# Patient Record
Sex: Female | Born: 1950 | Hispanic: No | Smoking: Never smoker
Health system: Southern US, Community
[De-identification: ages and names within clinical notes are randomized; demographics above are authoritative.]

## PROBLEM LIST (undated history)

## (undated) DIAGNOSIS — E079 Disorder of thyroid, unspecified: Secondary | ICD-10-CM

## (undated) HISTORY — DX: Disorder of thyroid, unspecified: E07.9

---

## 2019-06-21 ENCOUNTER — Other Ambulatory Visit: Payer: Self-pay | Admitting: Gynecology

## 2019-06-21 DIAGNOSIS — E039 Hypothyroidism, unspecified: Secondary | ICD-10-CM | POA: Diagnosis not present

## 2019-06-21 DIAGNOSIS — Z0000 Encounter for general adult medical examination without abnormal findings: Secondary | ICD-10-CM | POA: Diagnosis not present

## 2019-06-21 DIAGNOSIS — Z1231 Encounter for screening mammogram for malignant neoplasm of breast: Secondary | ICD-10-CM | POA: Diagnosis not present

## 2019-06-21 DIAGNOSIS — R5382 Chronic fatigue, unspecified: Secondary | ICD-10-CM | POA: Diagnosis not present

## 2019-06-21 DIAGNOSIS — Z833 Family history of diabetes mellitus: Secondary | ICD-10-CM | POA: Diagnosis not present

## 2019-06-23 ENCOUNTER — Ambulatory Visit
Admission: RE | Admit: 2019-06-23 | Discharge: 2019-06-23 | Disposition: A | Discharge status: Home / Self Care | Payer: Medicare Other | Source: Ambulatory Visit | Attending: Gynecology | Admitting: Gynecology

## 2019-06-23 ENCOUNTER — Other Ambulatory Visit: Payer: Self-pay

## 2019-06-23 DIAGNOSIS — Z1231 Encounter for screening mammogram for malignant neoplasm of breast: Secondary | ICD-10-CM

## 2019-06-24 ENCOUNTER — Other Ambulatory Visit: Payer: Self-pay | Admitting: Gynecology

## 2019-06-24 DIAGNOSIS — R928 Other abnormal and inconclusive findings on diagnostic imaging of breast: Secondary | ICD-10-CM

## 2019-06-28 ENCOUNTER — Other Ambulatory Visit: Payer: Self-pay | Admitting: Internal Medicine

## 2019-06-30 ENCOUNTER — Other Ambulatory Visit: Payer: Self-pay | Admitting: Internal Medicine

## 2019-07-01 ENCOUNTER — Other Ambulatory Visit: Payer: Self-pay | Admitting: Internal Medicine

## 2019-07-04 ENCOUNTER — Other Ambulatory Visit: Payer: Self-pay | Admitting: Gynecology

## 2019-07-04 ENCOUNTER — Ambulatory Visit
Admission: RE | Admit: 2019-07-04 | Discharge: 2019-07-04 | Disposition: A | Discharge status: Home / Self Care | Payer: Medicare Other | Source: Ambulatory Visit | Attending: Gynecology | Admitting: Gynecology

## 2019-07-04 ENCOUNTER — Other Ambulatory Visit: Payer: Self-pay

## 2019-07-04 ENCOUNTER — Ambulatory Visit: Admission: RE | Admit: 2019-07-04 | Payer: Medicare Other | Source: Ambulatory Visit

## 2019-07-04 DIAGNOSIS — R922 Inconclusive mammogram: Secondary | ICD-10-CM | POA: Diagnosis not present

## 2019-07-04 DIAGNOSIS — R928 Other abnormal and inconclusive findings on diagnostic imaging of breast: Secondary | ICD-10-CM

## 2019-12-25 ENCOUNTER — Ambulatory Visit: Payer: Medicare Other

## 2020-01-04 DIAGNOSIS — Z139 Encounter for screening, unspecified: Secondary | ICD-10-CM | POA: Diagnosis not present

## 2020-01-04 DIAGNOSIS — Z9181 History of falling: Secondary | ICD-10-CM | POA: Diagnosis not present

## 2020-01-04 DIAGNOSIS — Z0000 Encounter for general adult medical examination without abnormal findings: Secondary | ICD-10-CM | POA: Diagnosis not present

## 2020-01-13 ENCOUNTER — Ambulatory Visit: Payer: Medicare Other

## 2020-04-23 DIAGNOSIS — R5382 Chronic fatigue, unspecified: Secondary | ICD-10-CM | POA: Diagnosis not present

## 2020-04-23 DIAGNOSIS — E039 Hypothyroidism, unspecified: Secondary | ICD-10-CM | POA: Diagnosis not present

## 2020-04-23 DIAGNOSIS — Z6821 Body mass index (BMI) 21.0-21.9, adult: Secondary | ICD-10-CM | POA: Diagnosis not present

## 2020-06-27 DIAGNOSIS — H353131 Nonexudative age-related macular degeneration, bilateral, early dry stage: Secondary | ICD-10-CM | POA: Diagnosis not present

## 2020-07-24 DIAGNOSIS — M818 Other osteoporosis without current pathological fracture: Secondary | ICD-10-CM | POA: Diagnosis not present

## 2020-07-24 DIAGNOSIS — Z1231 Encounter for screening mammogram for malignant neoplasm of breast: Secondary | ICD-10-CM | POA: Diagnosis not present

## 2020-07-24 DIAGNOSIS — Z833 Family history of diabetes mellitus: Secondary | ICD-10-CM | POA: Diagnosis not present

## 2020-07-24 DIAGNOSIS — Z0000 Encounter for general adult medical examination without abnormal findings: Secondary | ICD-10-CM | POA: Diagnosis not present

## 2020-07-24 DIAGNOSIS — E039 Hypothyroidism, unspecified: Secondary | ICD-10-CM | POA: Diagnosis not present

## 2020-07-25 ENCOUNTER — Other Ambulatory Visit: Payer: Self-pay | Admitting: Internal Medicine

## 2020-07-25 DIAGNOSIS — Z1231 Encounter for screening mammogram for malignant neoplasm of breast: Secondary | ICD-10-CM

## 2020-07-27 ENCOUNTER — Other Ambulatory Visit: Payer: Self-pay

## 2020-07-27 ENCOUNTER — Ambulatory Visit
Admission: RE | Admit: 2020-07-27 | Discharge: 2020-07-27 | Disposition: A | Discharge status: Home / Self Care | Payer: Medicare Other | Source: Ambulatory Visit | Attending: Internal Medicine | Admitting: Internal Medicine

## 2020-07-27 DIAGNOSIS — Z1231 Encounter for screening mammogram for malignant neoplasm of breast: Secondary | ICD-10-CM

## 2020-12-04 IMAGING — MG DIGITAL SCREENING BILAT W/ TOMO W/ CAD
8 series · 9 of 24 positions shown · non-contrast
Comparison: Previous exam(s).

CLINICAL DATA: Screening.

EXAM:
DIGITAL SCREENING BILATERAL MAMMOGRAM WITH TOMO AND CAD

[R CC synth-2D]
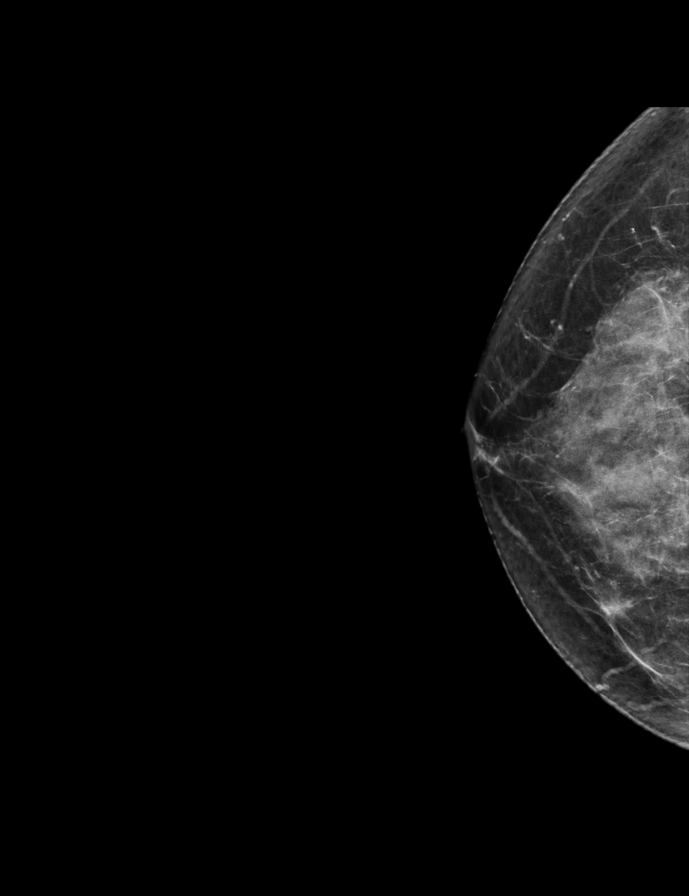

[R MLO synth-2D]
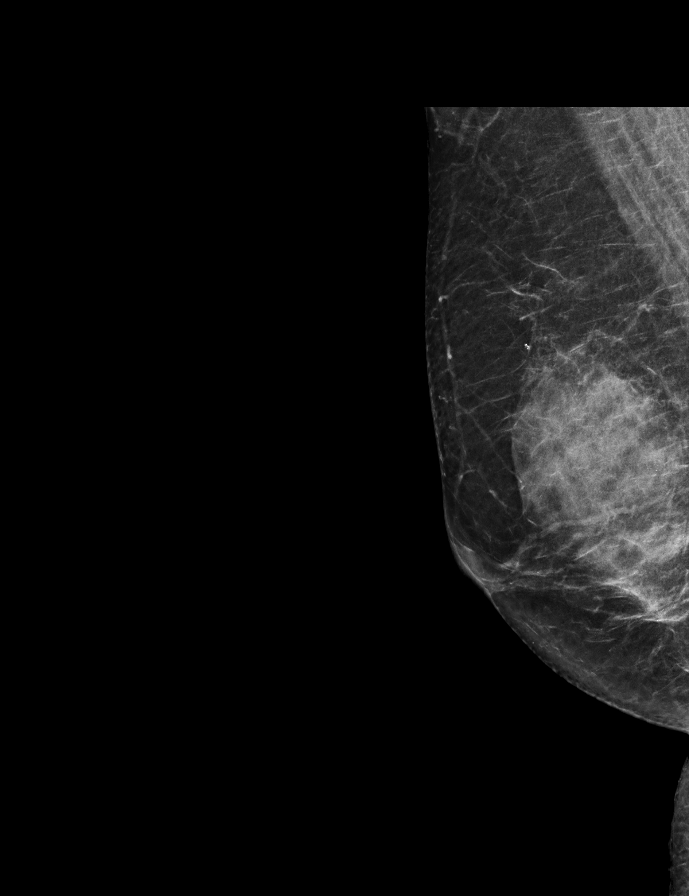

[L MLO synth-2D]
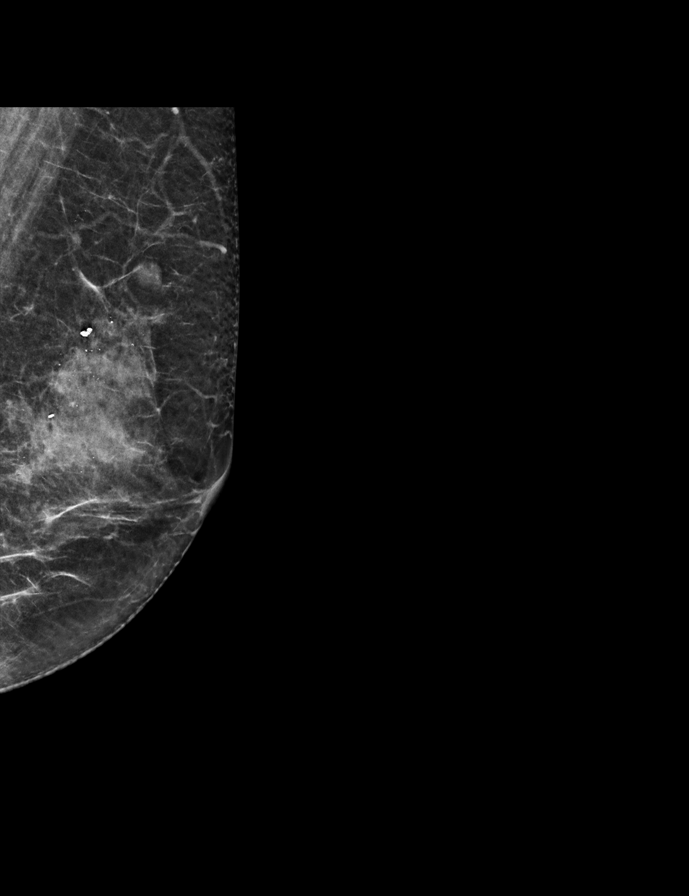

[L CC synth-2D]
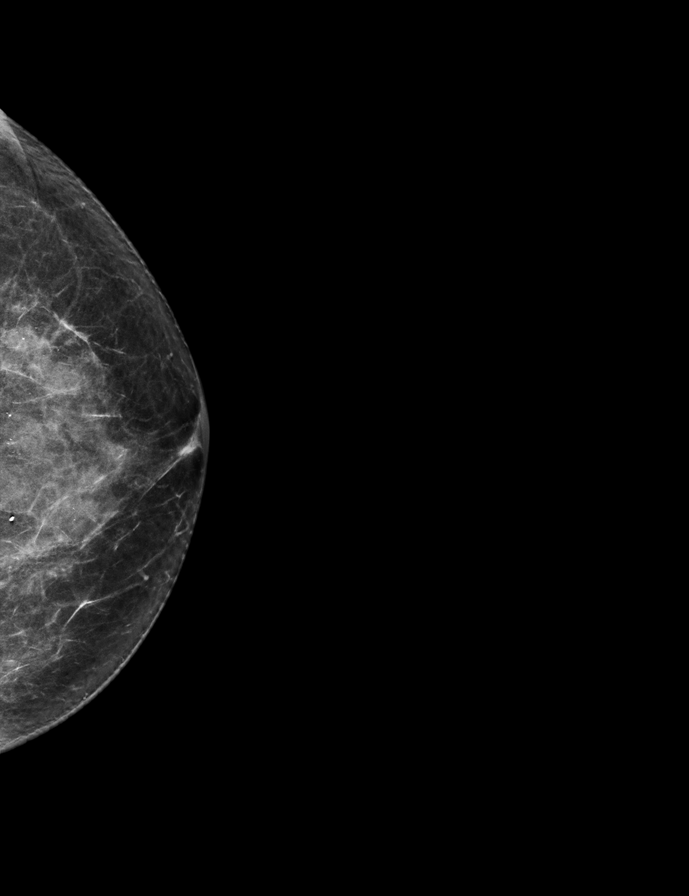

[R CC tomo · 2 of 62 frames shown]
[frame 21/62]
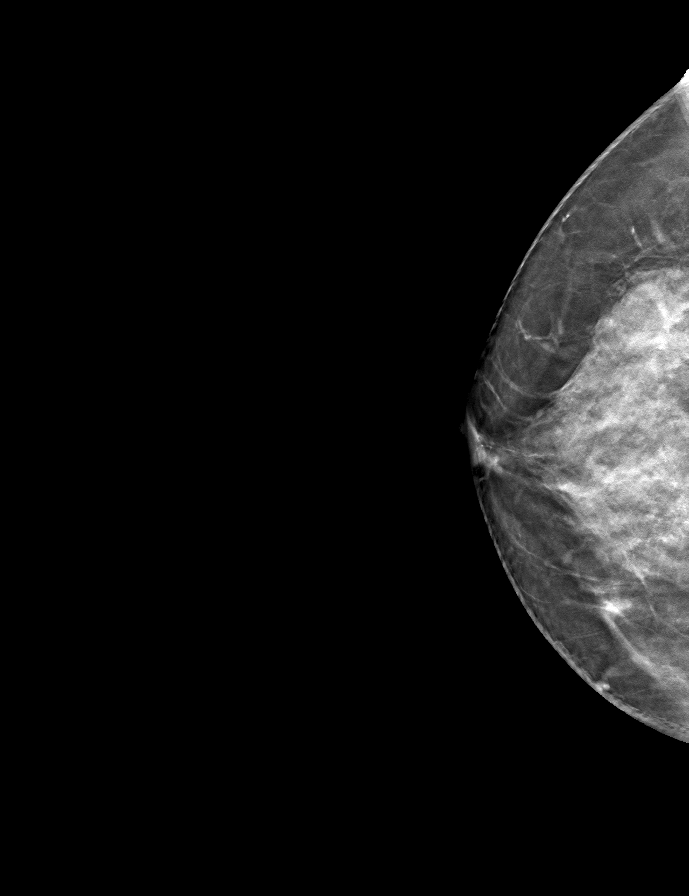
[frame 31/62]
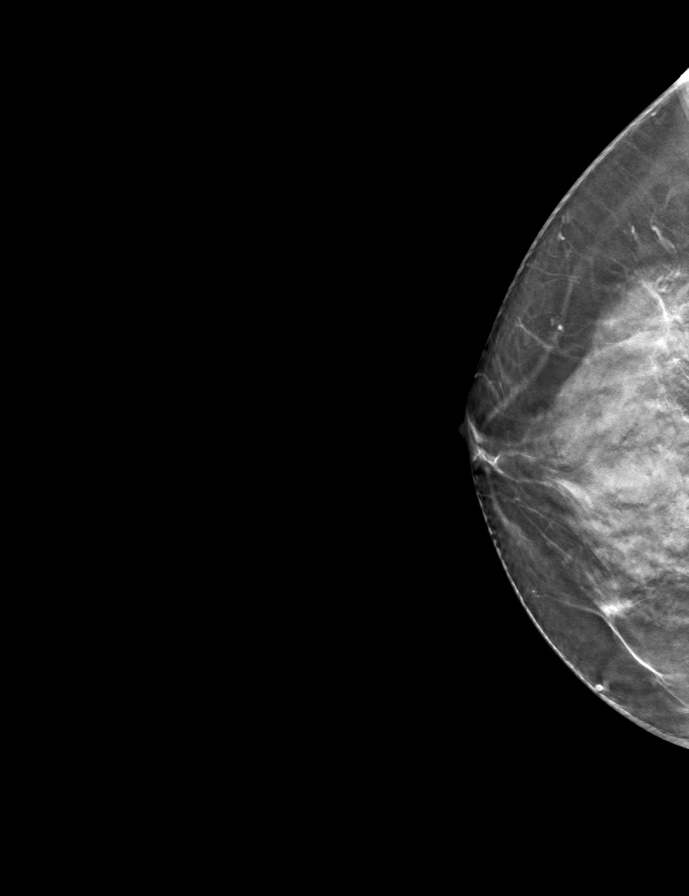

[L CC tomo · tomo slice 31/62.0]
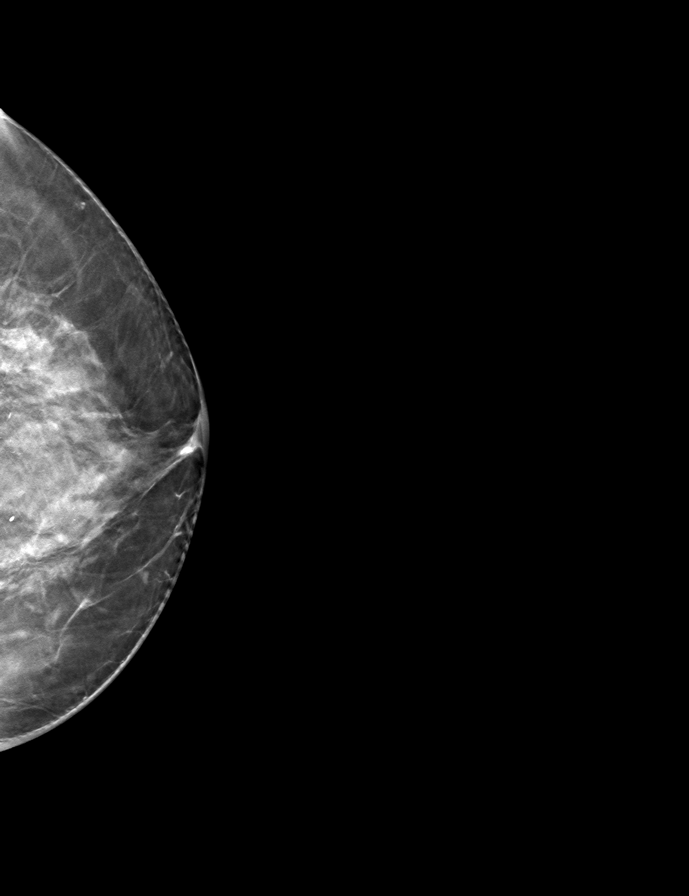

[R MLO tomo · tomo slice 31/60.0]
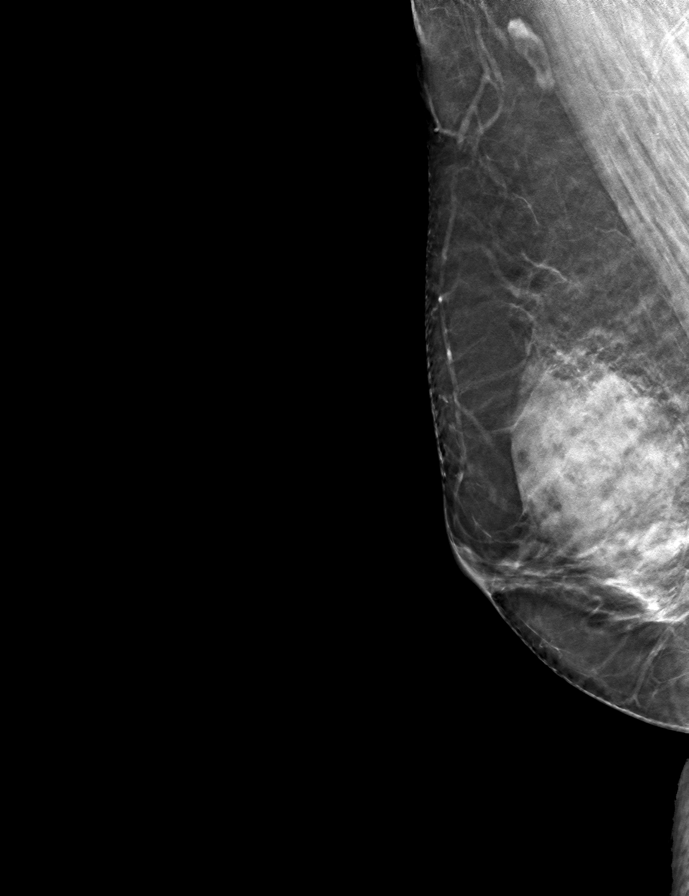

[L MLO tomo · tomo slice 29/58.0]
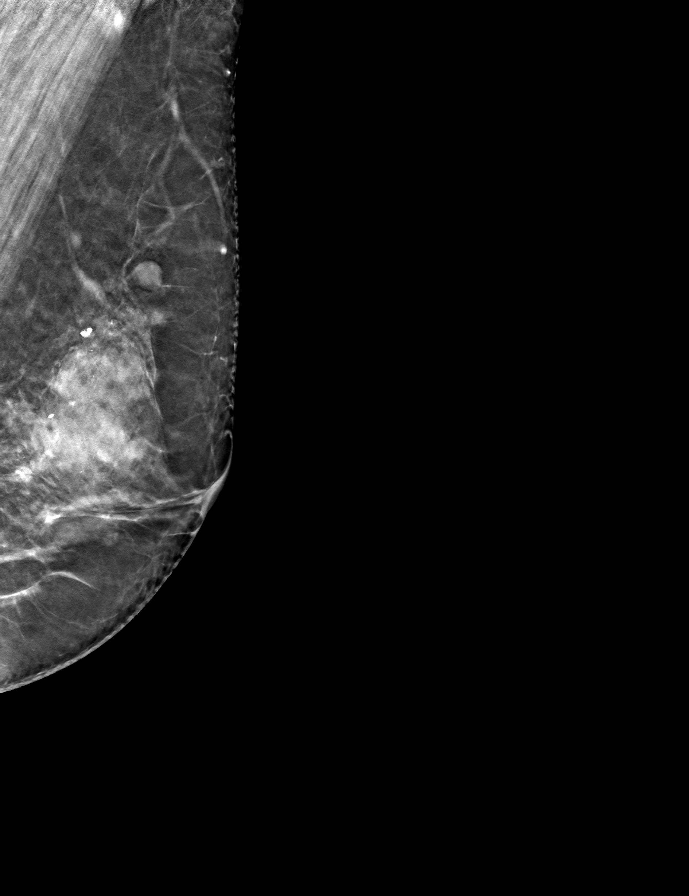

[9 of 24 positions shown; findings below may reference images not displayed]

ACR Breast Density Category d: The breast tissue is extremely dense,
which lowers the sensitivity of mammography
FINDINGS: There are no findings suspicious for malignancy. Images were
processed with CAD.
IMPRESSION: No mammographic evidence of malignancy. A result letter of this
screening mammogram will be mailed directly to the patient.

RECOMMENDATION:
Screening mammogram in one year. (Code:[id number])

BI-RADS CATEGORY  1: Negative.

## 2021-05-15 LAB — BASIC METABOLIC PANEL
BUN: 10 (ref 4–21)
CO2: 27 — AB (ref 13–22)
Chloride: 100 (ref 99–108)
Creatinine: 0.6 (ref 0.5–1.1)
Glucose: 78
Potassium: 4.5 mEq/L (ref 3.5–5.1)
Sodium: 136 — AB (ref 137–147)

## 2021-05-15 LAB — COMPREHENSIVE METABOLIC PANEL
Albumin: 4.4 (ref 3.5–5.0)
Calcium: 9.6 (ref 8.7–10.7)
Globulin: 2.8
eGFR: 91

## 2021-05-15 LAB — HEMOGLOBIN A1C: Hemoglobin A1C: 5.5

## 2021-05-15 LAB — HEPATIC FUNCTION PANEL
ALT: 17 U/L (ref 7–35)
AST: 34 (ref 13–35)
Alkaline Phosphatase: 68 (ref 25–125)
Bilirubin, Total: 0.9

## 2021-05-15 LAB — CBC: RBC: 3.82 — AB (ref 3.87–5.11)

## 2021-05-15 LAB — TSH: TSH: 0.24 — AB (ref 0.41–5.90)

## 2021-05-15 LAB — CBC AND DIFFERENTIAL
HCT: 38 (ref 36–46)
Hemoglobin: 12.3 (ref 12.0–16.0)
Platelets: 185 10*3/uL (ref 150–400)
WBC: 4.7

## 2021-05-16 LAB — LIPID PANEL
Cholesterol: 198 (ref 0–200)
HDL: 86 — AB (ref 35–70)
LDL Cholesterol: 98
Triglycerides: 48 (ref 40–160)

## 2021-05-29 ENCOUNTER — Other Ambulatory Visit: Payer: Self-pay | Admitting: Internal Medicine

## 2021-05-29 DIAGNOSIS — M810 Age-related osteoporosis without current pathological fracture: Secondary | ICD-10-CM

## 2021-05-29 DIAGNOSIS — Z1239 Encounter for other screening for malignant neoplasm of breast: Secondary | ICD-10-CM

## 2021-08-05 ENCOUNTER — Other Ambulatory Visit: Payer: Self-pay

## 2021-08-05 ENCOUNTER — Ambulatory Visit
Admission: RE | Admit: 2021-08-05 | Discharge: 2021-08-05 | Disposition: A | Discharge status: Home / Self Care | Payer: Medicare Other | Source: Ambulatory Visit | Attending: Internal Medicine | Admitting: Internal Medicine

## 2021-08-05 DIAGNOSIS — Z1239 Encounter for other screening for malignant neoplasm of breast: Secondary | ICD-10-CM

## 2021-12-05 ENCOUNTER — Other Ambulatory Visit: Payer: Medicare Other

## 2022-01-28 ENCOUNTER — Ambulatory Visit (INDEPENDENT_AMBULATORY_CARE_PROVIDER_SITE_OTHER): Payer: Medicare Other | Admitting: Family Medicine

## 2022-01-28 ENCOUNTER — Encounter: Payer: Self-pay | Admitting: Family Medicine

## 2022-01-28 DIAGNOSIS — E039 Hypothyroidism, unspecified: Secondary | ICD-10-CM

## 2022-01-28 DIAGNOSIS — M810 Age-related osteoporosis without current pathological fracture: Secondary | ICD-10-CM | POA: Insufficient documentation

## 2022-01-28 DIAGNOSIS — M8580 Other specified disorders of bone density and structure, unspecified site: Secondary | ICD-10-CM | POA: Insufficient documentation

## 2022-01-28 LAB — T4, FREE: Free T4: 1.22 ng/dL (ref 0.60–1.60)

## 2022-01-28 LAB — TSH: TSH: 4.11 u[IU]/mL (ref 0.35–5.50)

## 2022-01-28 MED ORDER — LEVOTHYROXINE SODIUM 50 MCG PO TABS: 50.0000 ug | ORAL_TABLET | Freq: Every day | ORAL | 3 refills | Status: DC

## 2022-01-28 NOTE — Assessment & Plan Note (Signed)
She will get bone density scan later this year.  She is on calcium and vitamin D supplement. ?

## 2022-01-28 NOTE — Assessment & Plan Note (Signed)
Check TSH and T4.  She is on Synthroid 50 mcg daily.  Refill sent in today. ?

## 2022-01-28 NOTE — Patient Instructions (Signed)
It was very nice to see you today! ? ?I will refill your thyroid medication. ? ?We will check your thyroid level today. ? ?Please come back in 3 to 6 months for your annual checkup.  Come back sooner if needed. ? ?Take care, ?Dr Jerline Pain ? ?PLEASE NOTE: ? ?If you had any lab tests please let us know if you have not heard back within a few days. You may see your results on mychart before we have a chance to review them but we will give you a call once they are reviewed by [place]. If we ordered any referrals today, please let us know if you have not heard from their office within the next week.  ? ?Please try these tips to maintain a healthy lifestyle: ? ?Eat at least 3 REAL meals and 1-2 snacks per day.  Aim for no more than 5 hours between eating.  If you eat breakfast, please do so within one hour of getting up.  ? ?Each meal should contain half fruits/vegetables, one quarter protein, and one quarter carbs (no bigger than a computer mouse) ? ?Cut down on sweet beverages. This includes juice, soda, and sweet tea.  ? ?Drink at least 1 glass of water with each meal and aim for at least 8 glasses per day ? ?Exercise at least 150 minutes every week.   ?

## 2022-01-28 NOTE — Progress Notes (Signed)
? ?Anita Spencer is a 71 y.o. female who presents today for an office visit. ? ?Assessment/Plan:  ?Chronic Problems Addressed Today: ?Hypothyroidism ?Check TSH and T4.  She is on Synthroid 50 mcg daily.  Refill sent in today. ? ?Osteopenia ?She will get bone density scan later this year.  She is on calcium and vitamin D supplement. ? ?  ?Subjective:  ?HPI: ? ?Patient here to establish care. She is a new patient. ? ?She has been doing well. She moved to [place] about 3 years ago from [place]. 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 She would like get blood work done today. She would like her thyroid level to be checked today. She has been taking Levothyroxine 50 Mcg daily for hypothyroidism. She is tolerating her medication. No side effects. She is requesting refill on her medication today. She has been exercising regularly. She likes to walk everyday.  ? ?She is up to date on her vaccines and screenings Had bone density scan in [place]. She had a hx of osteoporosis. She is currently taking multivitamin and calcium for this issue. She denies any back pain. ? ?ROS: Per HPI, otherwise a complete review of systems was negative.  ? ?PMH: ? ?The following were reviewed and entered/updated in epic: ?Past Medical History:  ?Diagnosis Date  ? Thyroid disease   ? ?Patient Active Problem List  ? Diagnosis Date Noted  ? Hypothyroidism 01/28/2022  ? Osteopenia 01/28/2022  ? ?History reviewed. No pertinent surgical history. ? ?History reviewed. No pertinent family history. ? ?Medications- reviewed and updated ?Current Outpatient Medications  ?Medication Sig Dispense Refill  ? levothyroxine (SYNTHROID) 50 MCG tablet Take 1 tablet (50 mcg total) by mouth daily before breakfast. 90 tablet 3  ? ?No current facility-administered medications for this visit.  ? ? ?Allergies-reviewed and updated ?Allergies  ?Allergen Reactions  ? Penicillins   ? ? ?Social History  ? ?Socioeconomic History  ? Marital status: Single  ?   Spouse name: Not on file  ? Number of children: Not on file  ? Years of education: Not on file  ? Highest education level: Not on file  ?Occupational History  ? Not on file  ?Tobacco Use  ? Smoking status: Never  ? Smokeless tobacco: Never  ?Vaping Use  ? Vaping Use: Never used  ?Substance and Sexual Activity  ? Alcohol use: Not Currently  ? Drug use: Not Currently  ? Sexual activity: Not Currently  ?Other Topics Concern  ? Not on file  ?Social History Narrative  ? Not on file  ? ?Social Determinants of Health  ? ?Financial Resource Strain: Not on file  ?Food Insecurity: Not on file  ?Transportation Needs: Not on file  ?Physical Activity: Not on file  ?Stress: Not on file  ?Social Connections: Not on file  ? ? ? ?   ?  ?Objective:  ?Physical Exam: ?BP 112/70   Pulse 73   Temp (!) 97.2 ?F (36.2 ?C) (Temporal)   Ht 5' 2.5" (1.588 m)   Wt 121 lb 6.4 oz (55.1 kg)   SpO2 99%   BMI 21.85 kg/m?   ?Gen: No acute distress, resting comfortably ?CV: Regular rate and rhythm with no murmurs appreciated ?Pulm: Normal work of breathing, clear to auscultation bilaterally with no crackles, wheezes, or rhonchi ?Neuro: Grossly normal, moves all extremities ?Psych: Normal affect and thought content ? ?   ? ? ?I,Savera Zaman,acting as a Neurosurgeon for Jacquiline Doe, MD.,have documented all relevant documentation on  the behalf of Jacquiline Doe, MD,as directed by  Jacquiline Doe, MD while in the presence of Jacquiline Doe, MD.  ? ?I, Jacquiline Doe, MD, have reviewed all documentation for this visit. The documentation on 01/28/22 for the exam, diagnosis, procedures, and orders are all accurate and complete. ? ?Katina Degree. Jimmey Ralph, MD ?01/28/2022 9:39 AM  ? ?

## 2022-01-29 NOTE — Progress Notes (Signed)
Please inform patient of the following: ? ?Thyroid levels are NORMAL.  She should continue her current dose and we can recheck at her next office visit.

## 2022-02-19 ENCOUNTER — Ambulatory Visit: Payer: Medicare Other | Admitting: Family Medicine

## 2022-05-22 ENCOUNTER — Ambulatory Visit (INDEPENDENT_AMBULATORY_CARE_PROVIDER_SITE_OTHER): Payer: Medicare Other

## 2022-05-22 DIAGNOSIS — Z0000 Encounter for general adult medical examination without abnormal findings: Secondary | ICD-10-CM

## 2022-05-22 NOTE — Patient Instructions (Signed)
Anita Spencer , Thank you for taking time to come for your Medicare Wellness Visit. I appreciate your ongoing commitment to your health goals. Please review the following plan we discussed and let me know if I can assist you in the future.   Screening recommendations/referrals: Colonoscopy: Done 11/17/13 repeat every 10 years  Mammogram: Done 08/05/21 repeat every year  Bone density 01/28/22 repeat every 2 years  Recommended yearly ophthalmology/optometry visit for glaucoma screening and checkup Recommended yearly dental visit for hygiene and checkup  Vaccinations: Influenza vaccine: done 09/17/21 repeat every 3 years  Pneumococcal vaccine: Up to date Tdap vaccine: Done 06/11/16 repeat every 10 years  Shingles vaccine: completed 6/1, 07/30/15   Covid-19:completed 2/2, 3/4, 09/03/20 & 5/22, 08/01/21  Advanced directives: Advance directive discussed with you today. Even though you declined this today please call our office should you change your mind and we can give you the proper paperwork for you to fill out.  Conditions/risks identified: None at this time   Next appointment: Follow up in one year for your annual wellness visit    Preventive Care 65 Years and Older, Female Preventive care refers to lifestyle choices and visits with your health care provider that can promote health and wellness. What does preventive care include? A yearly physical exam. This is also called an annual well check. Dental exams once or twice a year. Routine eye exams. Ask your health care provider how often you should have your eyes checked. Personal lifestyle choices, including: Daily care of your teeth and gums. Regular physical activity. Eating a healthy diet. Avoiding tobacco and drug use. Limiting alcohol use. Practicing safe sex. Taking low-dose aspirin every day. Taking vitamin and mineral supplements as recommended by your health care provider. What happens during an annual well check? The services and  screenings done by your health care provider during your annual well check will depend on your age, overall health, lifestyle risk factors, and family history of disease. Counseling  Your health care provider may ask you questions about your: Alcohol use. Tobacco use. Drug use. Emotional well-being. Home and relationship well-being. Sexual activity. Eating habits. History of falls. Memory and ability to understand (cognition). Work and work Astronomer. Reproductive health. Screening  You may have the following tests or measurements: Height, weight, and BMI. Blood pressure. Lipid and cholesterol levels. These may be checked every 5 years, or more frequently if you are over 76 years old. Skin check. Lung cancer screening. You may have this screening every year starting at age 7 if you have a 30-pack-year history of smoking and currently smoke or have quit within the past 15 years. Fecal occult blood test (FOBT) of the stool. You may have this test every year starting at age 50. Flexible sigmoidoscopy or colonoscopy. You may have a sigmoidoscopy every 5 years or a colonoscopy every 10 years starting at age 36. Hepatitis C blood test. Hepatitis B blood test. Sexually transmitted disease (STD) testing. Diabetes screening. This is done by checking your blood sugar (glucose) after you have not eaten for a while (fasting). You may have this done every 1-3 years. Bone density scan. This is done to screen for osteoporosis. You may have this done starting at age 2. Mammogram. This may be done every 1-2 years. Talk to your health care provider about how often you should have regular mammograms. Talk with your health care provider about your test results, treatment options, and if necessary, the need for more tests. Vaccines  Your health care  provider may recommend certain vaccines, such as: Influenza vaccine. This is recommended every year. Tetanus, diphtheria, and acellular pertussis (Tdap,  Td) vaccine. You may need a Td booster every 10 years. Zoster vaccine. You may need this after age 72. Pneumococcal 13-valent conjugate (PCV13) vaccine. One dose is recommended after age 53. Pneumococcal polysaccharide (PPSV23) vaccine. One dose is recommended after age 23. Talk to your health care provider about which screenings and vaccines you need and how often you need them. This information is not intended to replace advice given to you by your health care provider. Make sure you discuss any questions you have with your health care provider. Document Released: 11/30/2015 Document Revised: 07/23/2016 Document Reviewed: 09/04/2015 Elsevier Interactive Patient Education  2017 [employer] Prevention in the Home Falls can cause injuries. They can happen to people of all ages. There are many things you can do to make your home safe and to help prevent falls. What can I do on the outside of my home? Regularly fix the edges of walkways and driveways and fix any cracks. Remove anything that might make you trip as you walk through a door, such as a raised step or threshold. Trim any bushes or trees on the path to your home. Use bright outdoor lighting. Clear any walking paths of anything that might make someone trip, such as rocks or tools. Regularly check to see if handrails are loose or broken. Make sure that both sides of any steps have handrails. Any raised decks and porches should have guardrails on the edges. Have any leaves, snow, or ice cleared regularly. Use sand or salt on walking paths during winter. Clean up any spills in your garage right away. This includes oil or grease spills. What can I do in the bathroom? Use night lights. Install grab bars by the toilet and in the tub and shower. Do not use towel bars as grab bars. Use non-skid mats or decals in the tub or shower. If you need to sit down in the shower, use a plastic, non-slip stool. Keep the floor dry. Clean up any  water that spills on the floor as soon as it happens. Remove soap buildup in the tub or shower regularly. Attach bath mats securely with double-sided non-slip rug tape. Do not have throw rugs and other things on the floor that can make you trip. What can I do in the bedroom? Use night lights. Make sure that you have a light by your bed that is easy to reach. Do not use any sheets or blankets that are too big for your bed. They should not hang down onto the floor. Have a firm chair that has side arms. You can use this for support while you get dressed. Do not have throw rugs and other things on the floor that can make you trip. What can I do in the kitchen? Clean up any spills right away. Avoid walking on wet floors. Keep items that you use a lot in easy-to-reach places. If you need to reach something above you, use a strong step stool that has a grab bar. Keep electrical cords out of the way. Do not use floor polish or wax that makes floors slippery. If you must use wax, use non-skid floor wax. Do not have throw rugs and other things on the floor that can make you trip. What can I do with my stairs? Do not leave any items on the stairs. Make sure that there are handrails on both  sides of the stairs and use them. Fix handrails that are broken or loose. Make sure that handrails are as long as the stairways. Check any carpeting to make sure that it is firmly attached to the stairs. Fix any carpet that is loose or worn. Avoid having throw rugs at the top or bottom of the stairs. If you do have throw rugs, attach them to the floor with carpet tape. Make sure that you have a light switch at the top of the stairs and the bottom of the stairs. If you do not have them, ask someone to add them for you. What else can I do to help prevent falls? Wear shoes that: Do not have high heels. Have rubber bottoms. Are comfortable and fit you well. Are closed at the toe. Do not wear sandals. If you use a  stepladder: Make sure that it is fully opened. Do not climb a closed stepladder. Make sure that both sides of the stepladder are locked into place. Ask someone to hold it for you, if possible. Clearly mark and make sure that you can see: Any grab bars or handrails. First and last steps. Where the edge of each step is. Use tools that help you move around (mobility aids) if they are needed. These include: Canes. Walkers. Scooters. Crutches. Turn on the lights when you go into a dark area. Replace any light bulbs as soon as they burn out. Set up your furniture so you have a clear path. Avoid moving your furniture around. If any of your floors are uneven, fix them. If there are any pets around you, be aware of where they are. Review your medicines with your doctor. Some medicines can make you feel dizzy. This can increase your chance of falling. Ask your doctor what other things that you can do to help prevent falls. This information is not intended to replace advice given to you by your health care provider. Make sure you discuss any questions you have with your health care provider. Document Released: 08/30/2009 Document Revised: 04/10/2016 Document Reviewed: 12/08/2014 Elsevier Interactive Patient Education  2017 [employer].

## 2022-05-22 NOTE — Progress Notes (Signed)
Virtual Visit via Telephone Note  I connected with  Anita Spencer on 05/22/22 at  8:30 AM EDT by telephone and verified that I am speaking with the correct person using two identifiers.  Medicare Annual Wellness visit completed telephonically due to Covid-19 pandemic.   Persons participating in this call: This Health Coach and this patient.   Location: Patient: home Provider: office   I discussed the limitations, risks, security and privacy concerns of performing an evaluation and management service by telephone and the availability of in person appointments. The patient expressed understanding and agreed to proceed.  Unable to perform video visit due to video visit attempted and failed and/or patient does not have video capability.   Some vital signs may be absent or patient reported.   Anita Schlein, LPN   Subjective:   Anita Spencer is a 71 y.o. female who presents for an Initial Medicare Annual Wellness Visit.  Review of Systems     Cardiac Risk Factors include: advanced age (>78men, >33 women)     Objective:    There were no vitals filed for this visit. There is no height or weight on file to calculate BMI.     05/22/2022    8:28 AM  Advanced Directives  Does Patient Have a Medical Advance Directive? No  Would patient like information on creating a medical advance directive? No - Patient declined    Current Medications (verified) Outpatient Encounter Medications as of 05/22/2022  Medication Sig   levothyroxine (SYNTHROID) 50 MCG tablet Take 1 tablet (50 mcg total) by mouth daily before breakfast.   No facility-administered encounter medications on file as of 05/22/2022.    Allergies (verified) Penicillins   History: Past Medical History:  Diagnosis Date   Thyroid disease    History reviewed. No pertinent surgical history. History reviewed. No pertinent family history. Social History   Socioeconomic History   Marital status: Divorced    Spouse name: Not on  file   Number of children: Not on file   Years of education: Not on file   Highest education level: Not on file  Occupational History   Not on file  Tobacco Use   Smoking status: Never   Smokeless tobacco: Never  Vaping Use   Vaping Use: Never used  Substance and Sexual Activity   Alcohol use: Not Currently   Drug use: Not Currently   Sexual activity: Not Currently  Other Topics Concern   Not on file  Social History Narrative   Not on file   Social Determinants of Health   Financial Resource Strain: Low Risk  (05/22/2022)   Overall Financial Resource Strain (CARDIA)    Difficulty of Paying Living Expenses: Not hard at all  Food Insecurity: No Food Insecurity (05/22/2022)   Hunger Vital Sign    Worried About Running Out of Food in the Last Year: Never true    Ran Out of Food in the Last Year: Never true  Transportation Needs: No Transportation Needs (05/22/2022)   PRAPARE - Administrator, Civil Service (Medical): No    Lack of Transportation (Non-Medical): No  Physical Activity: Sufficiently Active (05/22/2022)   Exercise Vital Sign    Days of Exercise per Week: 7 days    Minutes of Exercise per Session: 90 min  Stress: No Stress Concern Present (05/22/2022)   [employer] of Occupational Health - Occupational Stress Questionnaire    Feeling of Stress : Not at all  Social Connections: Moderately Isolated (05/22/2022)  Social Advertising account executive [NHANES]    Frequency of Communication with Friends and Family: More than three times a week    Frequency of Social Gatherings with Friends and Family: More than three times a week    Attends Religious Services: More than 4 times per year    Active Member of [employer] or Organizations: No    Attends Engineer, structural: Never    Marital Status: Divorced    Tobacco Counseling Counseling given: Not Answered   Clinical Intake:  Pre-visit preparation completed: Yes  Pain : No/denies pain     BMI -  recorded: 21.85 Nutritional Risks: None Diabetes: No  How often do you need to have someone help you when you read instructions, pamphlets, or other written materials from your doctor or pharmacy?: 1 - Never  Diabetic?no  Interpreter Needed?: No  Information entered by :: Anita Ensign, LPN   Activities of Daily Living    05/22/2022    8:30 AM  In your present state of health, do you have any difficulty performing the following activities:  Hearing? 0  Vision? 0  Difficulty concentrating or making decisions? 0  Walking or climbing stairs? 0  Dressing or bathing? 0  Doing errands, shopping? 0  Preparing Food and eating ? N  Using the Toilet? N  In the past six months, have you accidently leaked urine? N  Do you have problems with loss of bowel control? N  Managing your Medications? N  Managing your Finances? N  Housekeeping or managing your Housekeeping? N    Patient Care Team: Ardith Dark, MD as PCP - General (Family Medicine)  Indicate any recent Medical Services you may have received from other than [facility] providers in the past year (date may be approximate).     Assessment:   This is a routine wellness examination for Anita Spencer.  Hearing/Vision screen Hearing Screening - Comments:: Pt denies any hearing  Vision Screening - Comments:: Pt will follow up with new Eye Dr   Dietary issues and exercise activities discussed: Current Exercise Habits: Home exercise routine, Type of exercise: walking, Time (Minutes): > 60, Frequency (Times/Week): 7, Weekly Exercise (Minutes/Week): 0   Goals Addressed             This Visit's Progress    Patient Stated       None at this time        Depression Screen    05/22/2022    8:26 AM  PHQ 2/9 Scores  PHQ - 2 Score 1    Fall Risk    05/22/2022    8:29 AM  Fall Risk   Falls in the past year? 0  Number falls in past yr: 0  Injury with Fall? 0  Risk for fall due to : Impaired vision  Follow up Falls prevention  discussed    FALL RISK PREVENTION PERTAINING TO THE HOME:  Any stairs in or around the home? Yes  If so, are there any without handrails? No  Home free of loose throw rugs in walkways, pet beds, electrical cords, etc? Yes  Adequate lighting in your home to reduce risk of falls? Yes   ASSISTIVE DEVICES UTILIZED TO PREVENT FALLS:  Life alert? No  Use of a cane, walker or w/c? No  Grab bars in the bathroom? No  Shower chair or bench in shower? No  Elevated toilet seat or a handicapped toilet? No   TIMED UP AND GO:  Was the test performed?  No .   Cognitive Function:        05/22/2022    8:31 AM  6CIT Screen  What Year? 0 points  What month? 0 points  What time? 0 points  Count back from 20 0 points  Months in reverse 0 points  Repeat phrase 0 points  Total Score 0 points    Immunizations Immunization History  Administered Date(s) Administered   Influenza-Unspecified 09/17/2021   Moderna Sars-Covid-2 Vaccination 12/20/2019, 01/19/2020, 09/03/2020, 03/18/2021, 08/01/2021   Pneumococcal Conjugate-13 04/17/2017   Pneumococcal Polysaccharide-23 11/17/2018   Tdap 06/11/2016   Zoster Recombinat (Shingrix) 04/18/2015, 07/30/2015    TDAP status: Up to date  Flu Vaccine status: Up to date  Pneumococcal vaccine status: Up to date  Covid-19 vaccine status: Completed vaccines  Qualifies for Shingles Vaccine? Yes   Zostavax completed Yes   Shingrix Completed?: Yes  Screening Tests Health Maintenance  Topic Date Due   Hepatitis C Screening  Never done   DEXA SCAN  Never done   COVID-19 Vaccine (6 - Moderna series) 09/26/2021   INFLUENZA VACCINE  06/17/2022   MAMMOGRAM  08/06/2023   COLONOSCOPY (Pts 45-77yrs Insurance coverage will need to be confirmed)  11/08/2023   TETANUS/TDAP  06/11/2026   Pneumonia Vaccine 33+ Years old  Completed   Zoster Vaccines- Shingrix  Completed   HPV VACCINES  Aged Out    Health Maintenance  Health Maintenance Due  Topic Date Due    Hepatitis C Screening  Never done   DEXA SCAN  Never done   COVID-19 Vaccine (6 - Moderna series) 09/26/2021    Colorectal cancer screening: Type of screening: Colonoscopy. Completed 11/07/13. Repeat every 10 years  Mammogram status: Completed 08/05/21. Repeat every year    Additional Screening:  Hepatitis C Screening: does qualify   Vision Screening: Recommended annual ophthalmology exams for early detection of glaucoma and other disorders of the eye. Is the patient up to date with their annual eye exam?  Yes  Who is the provider or what is the name of the office in which the patient attends annual eye exams? Wants to find a new provider  If pt is not established with a provider, would they like to be referred to a provider to establish care? Yes .   Dental Screening: Recommended annual dental exams for proper oral hygiene  Community Resource Referral / Chronic Care Management: CRR required this visit?  No   CCM required this visit?  No      Plan:     I have personally reviewed and noted the following in the patient's chart:   Medical and social history Use of alcohol, tobacco or illicit drugs  Current medications and supplements including opioid prescriptions. Patient is not currently taking opioid prescriptions. Functional ability and status Nutritional status Physical activity Advanced directives List of other physicians Hospitalizations, surgeries, and ER visits in previous 12 months Vitals Screenings to include cognitive, depression, and falls Referrals and appointments  In addition, I have reviewed and discussed with patient certain preventive protocols, quality metrics, and best practice recommendations. A written personalized care plan for preventive services as well as general preventive health recommendations were provided to patient.     Anita Schlein, LPN   2/0/9470   Nurse Notes: none

## 2022-05-30 ENCOUNTER — Ambulatory Visit (INDEPENDENT_AMBULATORY_CARE_PROVIDER_SITE_OTHER): Payer: Medicare Other | Admitting: Family Medicine

## 2022-05-30 ENCOUNTER — Encounter: Payer: Self-pay | Admitting: Family Medicine

## 2022-05-30 VITALS — BP 102/68 | HR 64 | Temp 97.6°F | Ht 62.5 in | Wt 115.6 lb

## 2022-05-30 DIAGNOSIS — Z13220 Encounter for screening for lipoid disorders: Secondary | ICD-10-CM

## 2022-05-30 DIAGNOSIS — M8580 Other specified disorders of bone density and structure, unspecified site: Secondary | ICD-10-CM | POA: Diagnosis not present

## 2022-05-30 DIAGNOSIS — R739 Hyperglycemia, unspecified: Secondary | ICD-10-CM | POA: Diagnosis not present

## 2022-05-30 DIAGNOSIS — E039 Hypothyroidism, unspecified: Secondary | ICD-10-CM | POA: Diagnosis not present

## 2022-05-30 DIAGNOSIS — Z1159 Encounter for screening for other viral diseases: Secondary | ICD-10-CM

## 2022-05-30 DIAGNOSIS — Z0001 Encounter for general adult medical examination with abnormal findings: Secondary | ICD-10-CM | POA: Diagnosis not present

## 2022-05-30 DIAGNOSIS — G4730 Sleep apnea, unspecified: Secondary | ICD-10-CM

## 2022-05-30 LAB — COMPREHENSIVE METABOLIC PANEL
ALT: 15 U/L (ref 0–35)
AST: 29 U/L (ref 0–37)
Albumin: 4.4 g/dL (ref 3.5–5.2)
Alkaline Phosphatase: 57 U/L (ref 39–117)
BUN: 11 mg/dL (ref 6–23)
CO2: 28 mEq/L (ref 19–32)
Calcium: 9.4 mg/dL (ref 8.4–10.5)
Chloride: 98 mEq/L (ref 96–112)
Creatinine, Ser: 0.69 mg/dL (ref 0.40–1.20)
GFR: 88.06 mL/min (ref >60.00)
Glucose, Bld: 93 mg/dL (ref 70–99)
Potassium: 4.6 mEq/L (ref 3.5–5.1)
Sodium: 133 mEq/L — ABNORMAL LOW (ref 135–145)
Total Bilirubin: 0.9 mg/dL (ref 0.2–1.2)
Total Protein: 6.9 g/dL (ref 6.0–8.3)

## 2022-05-30 LAB — LIPID PANEL
Cholesterol: 212 mg/dL — ABNORMAL HIGH (ref 0–200)
HDL: 88.7 mg/dL (ref >39.00)
LDL Cholesterol: 115 mg/dL — ABNORMAL HIGH (ref 0–99)
NonHDL: 123.22
Total CHOL/HDL Ratio: 2
Triglycerides: 41 mg/dL (ref 0.0–149.0)
VLDL: 8.2 mg/dL (ref 0.0–40.0)

## 2022-05-30 LAB — T4, FREE: Free T4: 1.13 ng/dL (ref 0.60–1.60)

## 2022-05-30 LAB — CBC
HCT: 35.1 % — ABNORMAL LOW (ref 36.0–46.0)
Hemoglobin: 11.8 g/dL — ABNORMAL LOW (ref 12.0–15.0)
MCHC: 33.6 g/dL (ref 30.0–36.0)
MCV: 97.3 fl (ref 78.0–100.0)
Platelets: 178 10*3/uL (ref 150.0–400.0)
RBC: 3.61 Mil/uL — ABNORMAL LOW (ref 3.87–5.11)
RDW: 13 % (ref 11.5–15.5)
WBC: 4.6 10*3/uL (ref 4.0–10.5)

## 2022-05-30 LAB — HEMOGLOBIN A1C: Hgb A1c MFr Bld: 5.8 % (ref 4.6–6.5)

## 2022-05-30 LAB — TSH: TSH: 2 u[IU]/mL (ref 0.35–5.50)

## 2022-05-30 LAB — T3, FREE: T3, Free: 2.4 pg/mL (ref 2.3–4.2)

## 2022-05-30 NOTE — Patient Instructions (Signed)
It was very nice to see you today!  We will check blood work today.   Keep working on diet and exercise.   Please let me know if you like to have home sleep study done.  We will see back in 6 months.  Please come back to see [place] sooner if needed.  Take care, Dr Jerline Pain  PLEASE NOTE:  If you had any lab tests please let us know if you have not heard back within a few days. You may see your results on mychart before we have a chance to review them but we will give you a call once they are reviewed by [place]. If we ordered any referrals today, please let us know if you have not heard from their office within the next week.   Please try these tips to maintain a healthy lifestyle:  Eat at least 3 REAL meals and 1-2 snacks per day.  Aim for no more than 5 hours between eating.  If you eat breakfast, please do so within one hour of getting up.   Each meal should contain half fruits/vegetables, one quarter protein, and one quarter carbs (no bigger than a computer mouse)  Cut down on sweet beverages. This includes juice, soda, and sweet tea.   Drink at least 1 glass of water with each meal and aim for at least 8 glasses per day  Exercise at least 150 minutes every week.    Preventive Care 62 Years and Older, Female Preventive care refers to lifestyle choices and visits with your health care provider that can promote health and wellness. Preventive care visits are also called wellness exams. What can I expect for my preventive care visit? Counseling Your health care provider may ask you questions about your: Medical history, including: Past medical problems. Family medical history. Pregnancy and menstrual history. History of falls. Current health, including: Memory and ability to understand (cognition). Emotional well-being. Home life and relationship well-being. Sexual activity and sexual health. Lifestyle, including: Alcohol, nicotine or tobacco, and drug use. Access to  firearms. Diet, exercise, and sleep habits. Work and work Statistician. Sunscreen use. Safety issues such as seatbelt and bike helmet use. Physical exam Your health care provider will check your: Height and weight. These may be used to calculate your BMI (body mass index). BMI is a measurement that tells if you are at a healthy weight. Waist circumference. This measures the distance around your waistline. This measurement also tells if you are at a healthy weight and may help predict your risk of certain diseases, such as type 2 diabetes and high blood pressure. Heart rate and blood pressure. Body temperature. Skin for abnormal spots. What immunizations do I need?  Vaccines are usually given at various ages, according to a schedule. Your health care provider will recommend vaccines for you based on your age, medical history, and lifestyle or other factors, such as travel or where you work. What tests do I need? Screening Your health care provider may recommend screening tests for certain conditions. This may include: Lipid and cholesterol levels. Hepatitis C test. Hepatitis B test. HIV (human immunodeficiency virus) test. STI (sexually transmitted infection) testing, if you are at risk. Lung cancer screening. Colorectal cancer screening. Diabetes screening. This is done by checking your blood sugar (glucose) after you have not eaten for a while (fasting). Mammogram. Talk with your health care provider about how often you should have regular mammograms. BRCA-related cancer screening. This may be done if you have a family history  of breast, ovarian, tubal, or peritoneal cancers. Bone density scan. This is done to screen for osteoporosis. Talk with your health care provider about your test results, treatment options, and if necessary, the need for more tests. Follow these instructions at home: Eating and drinking  Eat a diet that includes fresh fruits and vegetables, whole grains, lean  protein, and low-fat dairy products. Limit your intake of foods with high amounts of sugar, saturated fats, and salt. Take vitamin and mineral supplements as recommended by your health care provider. Do not drink alcohol if your health care provider tells you not to drink. If you drink alcohol: Limit how much you have to 0-1 drink a day. Know how much alcohol is in your drink. In the [place] one drink equals one 12 oz bottle of beer (355 mL), one 5 oz glass of wine (148 mL), or one 1 oz glass of hard liquor (44 mL). Lifestyle Brush your teeth every morning and night with fluoride toothpaste. Floss one time each day. Exercise for at least 30 minutes 5 or more days each week. Do not use any products that contain nicotine or tobacco. These products include cigarettes, chewing tobacco, and vaping devices, such as e-cigarettes. If you need help quitting, ask your health care provider. Do not use drugs. If you are sexually active, practice safe sex. Use a condom or other form of protection in order to prevent STIs. Take aspirin only as told by your health care provider. Make sure that you understand how much to take and what form to take. Work with your health care provider to find out whether it is safe and beneficial for you to take aspirin daily. Ask your health care provider if you need to take a cholesterol-lowering medicine (statin). Find healthy ways to manage stress, such as: Meditation, yoga, or listening to music. Journaling. Talking to a trusted person. Spending time with friends and family. Minimize exposure to UV radiation to reduce your risk of skin cancer. Safety Always wear your seat belt while driving or riding in a vehicle. Do not drive: If you have been drinking alcohol. Do not ride with someone who has been drinking. When you are tired or distracted. While texting. If you have been using any mind-altering substances or drugs. Wear a helmet and other protective equipment during  sports activities. If you have firearms in your house, make sure you follow all gun safety procedures. What's next? Visit your health care provider once a year for an annual wellness visit. Ask your health care provider how often you should have your eyes and teeth checked. Stay up to date on all vaccines. This information is not intended to replace advice given to you by your health care provider. Make sure you discuss any questions you have with your health care provider. Document Revised: 05/01/2021 Document Reviewed: 05/01/2021 Elsevier Patient Education  [place].

## 2022-05-30 NOTE — Assessment & Plan Note (Signed)
Her son noticed that she occasionally snores.  No witnessed apneic episodes.  She feels like her sleep is refreshing.  We discussed sleep study if she may be interested in this though we will check with insurance first to see if it is covered.

## 2022-05-30 NOTE — Assessment & Plan Note (Signed)
Check TSH and T4.  She is on Synthroid 50 mcg daily.

## 2022-05-30 NOTE — Assessment & Plan Note (Signed)
Discussed calcium and vitamin D.  She will get bone density scan later this year.

## 2022-05-30 NOTE — Progress Notes (Signed)
Chief Complaint:  Anita Spencer is a 71 y.o. female who presents today for her annual comprehensive physical exam.    Assessment/Plan:  Chronic Problems Addressed Today: Sleep-disordered breathing Her son noticed that she occasionally snores.  No witnessed apneic episodes.  She feels like her sleep is refreshing.  We discussed sleep study if she may be interested in this though we will check with insurance first to see if it is covered.  Osteopenia Discussed calcium and vitamin D.  She will get bone density scan later this year.  Hypothyroidism Check TSH and T4.  She is on Synthroid 50 mcg daily.  Preventative Healthcare: Check labs.  Mammogram due later this year.  No longer needs Pap due to age.  Colonoscopy due next year.  Up-to-date on vaccines.  We will get DEXA scan later this year as well.  Patient Counseling(The following topics were reviewed and/or handout was given):  -Nutrition: Stressed importance of moderation in sodium/caffeine intake, saturated fat and cholesterol, caloric balance, sufficient intake of fresh fruits, vegetables, and fiber.  -Stressed the importance of regular exercise.   -Substance Abuse: Discussed cessation/primary prevention of tobacco, alcohol, or other drug use; driving or other dangerous activities under the influence; availability of treatment for abuse.   -Injury prevention: Discussed safety belts, safety helmets, smoke detector, smoking near bedding or upholstery.   -Sexuality: Discussed sexually transmitted diseases, partner selection, use of condoms, avoidance of unintended pregnancy and contraceptive alternatives.   -Dental health: Discussed importance of regular tooth brushing, flossing, and dental visits.  -Health maintenance and immunizations reviewed. Please refer to Health maintenance section.  Return to care in 1 year for next preventative visit.     Subjective:  HPI:  She has no acute complaints today.   Lifestyle Diet: Balanced.  Plenty of fruits and vegetables.  Exercise: Likes walking daily. 7000 - 10000 steps per day.      05/30/2022    8:39 AM  Depression screen PHQ 2/9  Decreased Interest 0  Down, Depressed, Hopeless 0  PHQ - 2 Score 0    Health Maintenance Due  Topic Date Due   Hepatitis C Screening  Never done   DEXA SCAN  Never done     ROS: Per HPI, otherwise a complete review of systems was negative.   PMH:  The following were reviewed and entered/updated in epic: Past Medical History:  Diagnosis Date   Thyroid disease    Patient Active Problem List   Diagnosis Date Noted   Sleep-disordered breathing 05/30/2022   Hypothyroidism 01/28/2022   Osteopenia 01/28/2022   History reviewed. No pertinent surgical history.  History reviewed. No pertinent family history.  Medications- reviewed and updated Current Outpatient Medications  Medication Sig Dispense Refill   levothyroxine (SYNTHROID) 50 MCG tablet Take 1 tablet (50 mcg total) by mouth daily before breakfast. 90 tablet 3   No current facility-administered medications for this visit.    Allergies-reviewed and updated Allergies  Allergen Reactions   Penicillins     Social History   Socioeconomic History   Marital status: Divorced    Spouse name: Not on file   Number of children: Not on file   Years of education: Not on file   Highest education level: Not on file  Occupational History   Not on file  Tobacco Use   Smoking status: Never   Smokeless tobacco: Never  Vaping Use   Vaping Use: Never used  Substance and Sexual Activity   Alcohol use: Not Currently  Drug use: Not Currently   Sexual activity: Not Currently  Other Topics Concern   Not on file  Social History Narrative   Not on file   Social Determinants of Health   Financial Resource Strain: Low Risk  (05/22/2022)   Overall Financial Resource Strain (CARDIA)    Difficulty of Paying Living Expenses: Not hard at all  Food Insecurity: No Food Insecurity  (05/22/2022)   Hunger Vital Sign    Worried About Running Out of Food in the Last Year: Never true    Ran Out of Food in the Last Year: Never true  Transportation Needs: No Transportation Needs (05/22/2022)   PRAPARE - Administrator, Civil Service (Medical): No    Lack of Transportation (Non-Medical): No  Physical Activity: Sufficiently Active (05/22/2022)   Exercise Vital Sign    Days of Exercise per Week: 7 days    Minutes of Exercise per Session: 90 min  Stress: No Stress Concern Present (05/22/2022)   [employer] of Occupational Health - Occupational Stress Questionnaire    Feeling of Stress : Not at all  Social Connections: Moderately Isolated (05/22/2022)   Social Connection and Isolation Panel [NHANES]    Frequency of Communication with Friends and Family: More than three times a week    Frequency of Social Gatherings with Friends and Family: More than three times a week    Attends Religious Services: More than 4 times per year    Active Member of [employer] or Organizations: No    Attends Banker Meetings: Never    Marital Status: Divorced        Objective:  Physical Exam: BP 102/68   Pulse 64   Temp 97.6 F (36.4 C) (Temporal)   Ht 5' 2.5" (1.588 m)   Wt 115 lb 9.6 oz (52.4 kg)   SpO2 99%   BMI 20.81 kg/m   Body mass index is 20.81 kg/m. Wt Readings from Last 3 Encounters:  05/30/22 115 lb 9.6 oz (52.4 kg)  01/28/22 121 lb 6.4 oz (55.1 kg)   Gen: NAD, resting comfortably HEENT: TMs normal bilaterally. OP clear. No thyromegaly noted.  CV: RRR with no murmurs appreciated Pulm: NWOB, CTAB with no crackles, wheezes, or rhonchi GI: Normal bowel sounds present. Soft, Nontender, Nondistended. MSK: no edema, cyanosis, or clubbing noted Skin: warm, dry Neuro: CN2-12 grossly intact. Strength 5/5 in upper and lower extremities. Reflexes symmetric and intact bilaterally.  Psych: Normal affect and thought content     Zariya Minner M. Jimmey Ralph, MD 05/30/2022  9:09 AM

## 2022-06-02 ENCOUNTER — Encounter: Payer: Self-pay | Admitting: Family Medicine

## 2022-06-02 DIAGNOSIS — E785 Hyperlipidemia, unspecified: Secondary | ICD-10-CM | POA: Insufficient documentation

## 2022-06-02 DIAGNOSIS — R739 Hyperglycemia, unspecified: Secondary | ICD-10-CM | POA: Insufficient documentation

## 2022-06-02 LAB — HEPATITIS C ANTIBODY: Hepatitis C Ab: NONREACTIVE

## 2022-06-02 NOTE — Progress Notes (Signed)
Please inform patient of the following:  Cholesterol and blood sugar levels are borderline but stable.  Everything else is stable.  Do not need to make any changes to treatment plan.  We can recheck in 6 months.

## 2022-06-04 ENCOUNTER — Ambulatory Visit
Admission: RE | Admit: 2022-06-04 | Discharge: 2022-06-04 | Disposition: A | Discharge status: Home / Self Care | Payer: Medicare Other | Source: Ambulatory Visit | Attending: Family Medicine | Admitting: Family Medicine

## 2022-06-04 DIAGNOSIS — M8580 Other specified disorders of bone density and structure, unspecified site: Secondary | ICD-10-CM

## 2022-06-04 DIAGNOSIS — Z0001 Encounter for general adult medical examination with abnormal findings: Secondary | ICD-10-CM

## 2022-06-10 ENCOUNTER — Ambulatory Visit (INDEPENDENT_AMBULATORY_CARE_PROVIDER_SITE_OTHER)
Admission: RE | Admit: 2022-06-10 | Discharge: 2022-06-10 | Disposition: A | Discharge status: Home / Self Care | Payer: Medicare Other | Source: Ambulatory Visit | Attending: Family Medicine | Admitting: Family Medicine

## 2022-06-10 DIAGNOSIS — M8580 Other specified disorders of bone density and structure, unspecified site: Secondary | ICD-10-CM

## 2022-06-10 DIAGNOSIS — M810 Age-related osteoporosis without current pathological fracture: Secondary | ICD-10-CM

## 2022-06-12 NOTE — Progress Notes (Signed)
Please inform patient of the following:  Her bone scan shows that she has osteoporosis. Recommend she schedule a appointment to discuss treatment options.

## 2022-06-16 ENCOUNTER — Encounter: Payer: Self-pay | Admitting: Family Medicine

## 2022-06-16 ENCOUNTER — Other Ambulatory Visit: Payer: Self-pay | Admitting: Internal Medicine

## 2022-06-16 ENCOUNTER — Ambulatory Visit (INDEPENDENT_AMBULATORY_CARE_PROVIDER_SITE_OTHER): Payer: Medicare Other | Admitting: Family Medicine

## 2022-06-16 ENCOUNTER — Other Ambulatory Visit: Payer: Self-pay | Admitting: Family Medicine

## 2022-06-16 VITALS — BP 120/71 | HR 67 | Temp 97.3°F | Ht 62.5 in | Wt 115.6 lb

## 2022-06-16 DIAGNOSIS — E039 Hypothyroidism, unspecified: Secondary | ICD-10-CM

## 2022-06-16 DIAGNOSIS — G4730 Sleep apnea, unspecified: Secondary | ICD-10-CM | POA: Diagnosis not present

## 2022-06-16 DIAGNOSIS — Z1231 Encounter for screening mammogram for malignant neoplasm of breast: Secondary | ICD-10-CM

## 2022-06-16 DIAGNOSIS — R739 Hyperglycemia, unspecified: Secondary | ICD-10-CM

## 2022-06-16 DIAGNOSIS — M810 Age-related osteoporosis without current pathological fracture: Secondary | ICD-10-CM | POA: Diagnosis not present

## 2022-06-16 NOTE — Assessment & Plan Note (Signed)
Last A1c 5.8. Discussed lifestyle modifications. Recheck in 6 months.

## 2022-06-16 NOTE — Assessment & Plan Note (Signed)
Had lengthy discussion with patient regarding her recent DEXA scan which showed a T score of -3.1 at her lumbar spine. We discussed treatment options to lower risk of fracture including fosamax, reclast, and prolia. She would like avoid any medications if possible and deferred starting any medications today. We can recheck DEXA in 2 years. She will continue calcium and vitamin D supplementation.

## 2022-06-16 NOTE — Assessment & Plan Note (Signed)
Last labs at goal. She is on synthroid 50mg  daily.

## 2022-06-16 NOTE — Progress Notes (Signed)
   Anita Spencer is a 71 y.o. female who presents today for an office visit.  Assessment/Plan:  Chronic Problems Addressed Today: Osteoporosis, last DEXA 2023 Had lengthy discussion with patient regarding her recent DEXA scan which showed a T score of -3.1 at her lumbar spine. We discussed treatment options to lower risk of fracture including fosamax, reclast, and prolia. She would like avoid any medications if possible and deferred starting any medications today. We can recheck DEXA in 2 years. She will continue calcium and vitamin D supplementation.   Hyperglycemia Last A1c 5.8. Discussed lifestyle modifications. Recheck in 6 months.   Hypothyroidism Last labs at goal. She is on synthroid 50mg  daily.     Subjective:  HPI:  See A/p for status of chronic conditions. She is here today to discuss her recent DEXA scan.         Objective:  Physical Exam: BP 120/71   Pulse 67   Temp (!) 97.3 F (36.3 C) (Temporal)   Ht 5' 2.5" (1.588 m)   Wt 115 lb 9.6 oz (52.4 kg)   SpO2 98%   BMI 20.81 kg/m   Gen: No acute distress, resting comfortably Neuro: Grossly normal, moves all extremities Psych: Normal affect and thought content      Rayder Sullenger M. , MD 06/16/2022 2:43 PM

## 2022-06-16 NOTE — Patient Instructions (Signed)
It was very nice to see you today!  Please let me know if you change your mind about medications for your bone density. We will recheck again in 2 years.   Take care, Dr Jimmey Ralph  PLEASE NOTE:  If you had any lab tests please let us know if you have not heard back within a few days. You may see your results on mychart before we have a chance to review them but we will give you a call once they are reviewed by [place]. If we ordered any referrals today, please let us know if you have not heard from their office within the next week.   Please try these tips to maintain a healthy lifestyle:  Eat at least 3 REAL meals and 1-2 snacks per day.  Aim for no more than 5 hours between eating.  If you eat breakfast, please do so within one hour of getting up.   Each meal should contain half fruits/vegetables, one quarter protein, and one quarter carbs (no bigger than a computer mouse)  Cut down on sweet beverages. This includes juice, soda, and sweet tea.   Drink at least 1 glass of water with each meal and aim for at least 8 glasses per day  Exercise at least 150 minutes every week.

## 2022-07-03 ENCOUNTER — Telehealth: Payer: Self-pay

## 2022-07-03 ENCOUNTER — Encounter: Payer: Self-pay | Admitting: Family

## 2022-07-03 ENCOUNTER — Ambulatory Visit (INDEPENDENT_AMBULATORY_CARE_PROVIDER_SITE_OTHER): Payer: Medicare Other | Admitting: Family

## 2022-07-03 VITALS — BP 104/67 | HR 71 | Temp 98.6°F | Ht 62.5 in | Wt 115.4 lb

## 2022-07-03 DIAGNOSIS — J020 Streptococcal pharyngitis: Secondary | ICD-10-CM | POA: Diagnosis not present

## 2022-07-03 DIAGNOSIS — U071 COVID-19: Secondary | ICD-10-CM | POA: Diagnosis not present

## 2022-07-03 LAB — POC COVID19 BINAXNOW: SARS Coronavirus 2 Ag: POSITIVE — AB

## 2022-07-03 LAB — POCT RAPID STREP A (OFFICE): Rapid Strep A Screen: NEGATIVE

## 2022-07-03 MED ORDER — AZITHROMYCIN 250 MG PO TABS: ORAL_TABLET | ORAL | 0 refills | Status: AC

## 2022-07-03 MED ORDER — NIRMATRELVIR/RITONAVIR (PAXLOVID)TABLET: 3.0000 | ORAL_TABLET | Freq: Two times a day (BID) | ORAL | 0 refills | Status: AC

## 2022-07-03 NOTE — Addendum Note (Signed)
Addended byDulce Sellar on: 07/03/2022 02:20 PM   Modules accepted: Orders, Level of Service

## 2022-07-03 NOTE — Telephone Encounter (Signed)
I called pt, Pt states she will go back to pharmacy to pick up Z pack.

## 2022-07-03 NOTE — Patient Instructions (Signed)
It was very nice to see you today!    You are positive for Covid infection. I have sent Paxlovid, the antiviral treatment for this infection. Start this today - and take twice a day as directed for 5 days.  Drink plenty of fluids! Ok to take Tylenol, Aleve, Advil, or sinus medication with this medication.  Call back if you are not feeling better in a few days.       PLEASE NOTE:  If you had any lab tests please let us know if you have not heard back within a few days. You may see your results on MyChart before we have a chance to review them but we will give you a call once they are reviewed by [place]. If we ordered any referrals today, please let us know if you have not heard from their office within the next week.

## 2022-07-03 NOTE — Progress Notes (Addendum)
   Patient ID: Anita Spencer, female    DOB: 12-Nov-1951, 70 y.o.   MRN: [id number]  Chief Complaint  Patient presents with   Sore Throat    Pt c/o sore throat, cough and runny nose since yesterday. Has tried [place] medicine which did not help much. Pt states it is hard to swallow.     HPI: Upper Respiratory Infection: Symptoms include congestion and cough, runny nose, and sore throat .  Onset of symptoms was 1 day ago, gradually worsening since that time. She is drinking moderate amounts of fluids. Evaluation to date: none.  Treatment to date:  Tylenol .  Assessment & Plan:  1. Strep throat rapid strep positive. Allergy to PCN, sending Z-pack, Advised on use & SE, ok to take Tylenol or Ibuprofen for sore throat pain, warm salt water gargles.  - POC COVID-19 - POCT rapid strep A - azithromycin (ZITHROMAX) 250 MG tablet; Take 2 tablets on day 1, then 1 tablet daily on days 2 through 5  Dispense: 6 tablet; Refill: 0  2. COVID-19 Sending Paxlovid, pt advised of FDA label for emergency use, how to take, & SE. Advised of CDC guidelines for masking if out in public. OK to continue taking OTC sinus or pain meds. Encouraged to monitor & notify office of any worsening symptoms: increased shortness of breath, weakness, and signs of dehydration. Instructed to rest and hydrate well.   - POC COVID-19 - POCT rapid strep A - nirmatrelvir/ritonavir EUA (PAXLOVID) 20 x 150 MG & 10 x 100MG  TABS; Take 3 tablets by mouth 2 (two) times daily for 5 days. (Take nirmatrelvir 150 mg two tablets twice daily for 5 days and ritonavir 100 mg one tablet twice daily for 5 days) Patient GFR is 78  Dispense: 30 tablet; Refill: 0  Subjective:    Outpatient Medications Prior to Visit  Medication Sig Dispense Refill   levothyroxine (SYNTHROID) 50 MCG tablet Take 1 tablet (50 mcg total) by mouth daily before breakfast. 90 tablet 3   No facility-administered medications prior to visit.   Past Medical History:  Diagnosis  Date   Thyroid disease    History reviewed. No pertinent surgical history. Allergies  Allergen Reactions   Penicillins       Objective:    Physical Exam BP 104/67 (BP Location: Left Arm, Patient Position: Sitting, Cuff Size: Large)   Pulse 71   Temp 98.6 F (37 C) (Temporal)   Ht 5' 2.5" (1.588 m)   Wt 115 lb 6 oz (52.3 kg)   SpO2 98%   BMI 20.77 kg/m  Wt Readings from Last 3 Encounters:  07/03/22 115 lb 6 oz (52.3 kg)  06/16/22 115 lb 9.6 oz (52.4 kg)  05/30/22 115 lb 9.6 oz (52.4 kg)       06/01/22, NP

## 2022-07-03 NOTE — Telephone Encounter (Signed)
-----   Message from Dulce Sellar, NP sent at 07/03/2022  2:13 PM EDT ----- Regarding: call patient be sure she picks up antibiotic for strep throat - apologize and let her know we did not read the test strip correctly the first time - but she needs to take this with the Paxlovid  to treat all of her symptoms.

## 2022-07-29 ENCOUNTER — Institutional Professional Consult (permissible substitution): Payer: Medicare Other | Admitting: Neurology

## 2022-08-06 ENCOUNTER — Ambulatory Visit: Payer: Medicare Other

## 2022-08-11 ENCOUNTER — Encounter: Payer: Self-pay | Admitting: *Deleted

## 2022-08-15 ENCOUNTER — Ambulatory Visit
Admission: RE | Admit: 2022-08-15 | Discharge: 2022-08-15 | Disposition: A | Discharge status: Home / Self Care | Payer: Medicare Other | Source: Ambulatory Visit | Attending: Family Medicine | Admitting: Family Medicine

## 2022-08-15 DIAGNOSIS — Z1231 Encounter for screening mammogram for malignant neoplasm of breast: Secondary | ICD-10-CM

## 2022-08-18 ENCOUNTER — Other Ambulatory Visit: Payer: Self-pay | Admitting: Family Medicine

## 2022-08-18 DIAGNOSIS — R928 Other abnormal and inconclusive findings on diagnostic imaging of breast: Secondary | ICD-10-CM

## 2022-08-26 ENCOUNTER — Other Ambulatory Visit: Payer: Medicare Other

## 2022-08-30 ENCOUNTER — Ambulatory Visit
Admission: RE | Admit: 2022-08-30 | Discharge: 2022-08-30 | Disposition: A | Discharge status: Home / Self Care | Payer: Medicare Other | Source: Ambulatory Visit | Attending: Family Medicine | Admitting: Family Medicine

## 2022-08-30 DIAGNOSIS — R928 Other abnormal and inconclusive findings on diagnostic imaging of breast: Secondary | ICD-10-CM

## 2022-08-30 DIAGNOSIS — R922 Inconclusive mammogram: Secondary | ICD-10-CM | POA: Diagnosis not present

## 2022-08-30 DIAGNOSIS — N6002 Solitary cyst of left breast: Secondary | ICD-10-CM | POA: Diagnosis not present

## 2022-09-22 DIAGNOSIS — H35363 Drusen (degenerative) of macula, bilateral: Secondary | ICD-10-CM | POA: Diagnosis not present

## 2022-10-30 ENCOUNTER — Encounter: Payer: Self-pay | Admitting: *Deleted

## 2022-12-06 ENCOUNTER — Other Ambulatory Visit: Payer: Self-pay | Admitting: Family Medicine

## 2022-12-25 DIAGNOSIS — H11153 Pinguecula, bilateral: Secondary | ICD-10-CM | POA: Diagnosis not present

## 2022-12-25 DIAGNOSIS — H25813 Combined forms of age-related cataract, bilateral: Secondary | ICD-10-CM | POA: Diagnosis not present

## 2022-12-25 DIAGNOSIS — H35032 Hypertensive retinopathy, left eye: Secondary | ICD-10-CM | POA: Diagnosis not present

## 2022-12-25 DIAGNOSIS — H35363 Drusen (degenerative) of macula, bilateral: Secondary | ICD-10-CM | POA: Diagnosis not present

## 2023-01-19 DIAGNOSIS — H25813 Combined forms of age-related cataract, bilateral: Secondary | ICD-10-CM | POA: Diagnosis not present

## 2023-02-04 DIAGNOSIS — H268 Other specified cataract: Secondary | ICD-10-CM | POA: Diagnosis not present

## 2023-02-04 DIAGNOSIS — H25812 Combined forms of age-related cataract, left eye: Secondary | ICD-10-CM | POA: Diagnosis not present

## 2023-02-10 DIAGNOSIS — H25811 Combined forms of age-related cataract, right eye: Secondary | ICD-10-CM | POA: Diagnosis not present

## 2023-02-18 DIAGNOSIS — H268 Other specified cataract: Secondary | ICD-10-CM | POA: Diagnosis not present

## 2023-02-18 DIAGNOSIS — H25811 Combined forms of age-related cataract, right eye: Secondary | ICD-10-CM | POA: Diagnosis not present

## 2023-06-04 ENCOUNTER — Ambulatory Visit: Payer: Medicare Other

## 2023-06-04 VITALS — Wt 115.0 lb

## 2023-06-04 DIAGNOSIS — Z0000 Encounter for general adult medical examination without abnormal findings: Secondary | ICD-10-CM | POA: Diagnosis not present

## 2023-06-04 NOTE — Patient Instructions (Signed)
Anita Spencer , Thank you for taking time to come for your Medicare Wellness Visit. I appreciate your ongoing commitment to your health goals. Please review the following plan we discussed and let me know if I can assist you in the future.   These are the goals we discussed:  Goals      Patient Stated     None at this time         This is a list of the screening recommended for you and due dates:  Health Maintenance  Topic Date Due   COVID-19 Vaccine (7 - 2023-24 season) 07/18/2022   Flu Shot  06/18/2023   Colon Cancer Screening  11/08/2023   Medicare Annual Wellness Visit  06/03/2024   DEXA scan (bone density measurement)  06/10/2024   Mammogram  08/15/2024   DTaP/Tdap/Td vaccine (2 - Td or Tdap) 06/11/2026   Pneumonia Vaccine  Completed   Hepatitis C Screening  Completed   Zoster (Shingles) Vaccine  Completed   HPV Vaccine  Aged Out    Advanced directives: Advance directive discussed with you today. Even though you declined this today please call our office should you change your mind and we can give you the proper paperwork for you to fill out.  Conditions/risks identified: none at this time   Next appointment: Follow up in one year for your annual wellness visit    Preventive Care 65 Years and Older, Female Preventive care refers to lifestyle choices and visits with your health care provider that can promote health and wellness. What does preventive care include? A yearly physical exam. This is also called an annual well check. Dental exams once or twice a year. Routine eye exams. Ask your health care provider how often you should have your eyes checked. Personal lifestyle choices, including: Daily care of your teeth and gums. Regular physical activity. Eating a healthy diet. Avoiding tobacco and drug use. Limiting alcohol use. Practicing safe sex. Taking low-dose aspirin every day. Taking vitamin and mineral supplements as recommended by your health care provider. What  happens during an annual well check? The services and screenings done by your health care provider during your annual well check will depend on your age, overall health, lifestyle risk factors, and family history of disease. Counseling  Your health care provider may ask you questions about your: Alcohol use. Tobacco use. Drug use. Emotional well-being. Home and relationship well-being. Sexual activity. Eating habits. History of falls. Memory and ability to understand (cognition). Work and work Astronomer. Reproductive health. Screening  You may have the following tests or measurements: Height, weight, and BMI. Blood pressure. Lipid and cholesterol levels. These may be checked every 5 years, or more frequently if you are over 48 years old. Skin check. Lung cancer screening. You may have this screening every year starting at age 36 if you have a 30-pack-year history of smoking and currently smoke or have quit within the past 15 years. Fecal occult blood test (FOBT) of the stool. You may have this test every year starting at age 41. Flexible sigmoidoscopy or colonoscopy. You may have a sigmoidoscopy every 5 years or a colonoscopy every 10 years starting at age 41. Hepatitis C blood test. Hepatitis B blood test. Sexually transmitted disease (STD) testing. Diabetes screening. This is done by checking your blood sugar (glucose) after you have not eaten for a while (fasting). You may have this done every 1-3 years. Bone density scan. This is done to screen for osteoporosis. You may have  this done starting at age 9. Mammogram. This may be done every 1-2 years. Talk to your health care provider about how often you should have regular mammograms. Talk with your health care provider about your test results, treatment options, and if necessary, the need for more tests. Vaccines  Your health care provider may recommend certain vaccines, such as: Influenza vaccine. This is recommended every  year. Tetanus, diphtheria, and acellular pertussis (Tdap, Td) vaccine. You may need a Td booster every 10 years. Zoster vaccine. You may need this after age 79. Pneumococcal 13-valent conjugate (PCV13) vaccine. One dose is recommended after age 62. Pneumococcal polysaccharide (PPSV23) vaccine. One dose is recommended after age 32. Talk to your health care provider about which screenings and vaccines you need and how often you need them. This information is not intended to replace advice given to you by your health care provider. Make sure you discuss any questions you have with your health care provider. Document Released: 11/30/2015 Document Revised: 07/23/2016 Document Reviewed: 09/04/2015 Elsevier Interactive Patient Education  2017 [employer].  Fall Prevention in the Home Falls can cause injuries. They can happen to people of all ages. There are many things you can do to make your home safe and to help prevent falls. What can I do on the outside of my home? Regularly fix the edges of walkways and driveways and fix any cracks. Remove anything that might make you trip as you walk through a door, such as a raised step or threshold. Trim any bushes or trees on the path to your home. Use bright outdoor lighting. Clear any walking paths of anything that might make someone trip, such as rocks or tools. Regularly check to see if handrails are loose or broken. Make sure that both sides of any steps have handrails. Any raised decks and porches should have guardrails on the edges. Have any leaves, snow, or ice cleared regularly. Use sand or salt on walking paths during winter. Clean up any spills in your garage right away. This includes oil or grease spills. What can I do in the bathroom? Use night lights. Install grab bars by the toilet and in the tub and shower. Do not use towel bars as grab bars. Use non-skid mats or decals in the tub or shower. If you need to sit down in the shower, use a  plastic, non-slip stool. Keep the floor dry. Clean up any water that spills on the floor as soon as it happens. Remove soap buildup in the tub or shower regularly. Attach bath mats securely with double-sided non-slip rug tape. Do not have throw rugs and other things on the floor that can make you trip. What can I do in the bedroom? Use night lights. Make sure that you have a light by your bed that is easy to reach. Do not use any sheets or blankets that are too big for your bed. They should not hang down onto the floor. Have a firm chair that has side arms. You can use this for support while you get dressed. Do not have throw rugs and other things on the floor that can make you trip. What can I do in the kitchen? Clean up any spills right away. Avoid walking on wet floors. Keep items that you use a lot in easy-to-reach places. If you need to reach something above you, use a strong step stool that has a grab bar. Keep electrical cords out of the way. Do not use floor polish or  wax that makes floors slippery. If you must use wax, use non-skid floor wax. Do not have throw rugs and other things on the floor that can make you trip. What can I do with my stairs? Do not leave any items on the stairs. Make sure that there are handrails on both sides of the stairs and use them. Fix handrails that are broken or loose. Make sure that handrails are as long as the stairways. Check any carpeting to make sure that it is firmly attached to the stairs. Fix any carpet that is loose or worn. Avoid having throw rugs at the top or bottom of the stairs. If you do have throw rugs, attach them to the floor with carpet tape. Make sure that you have a light switch at the top of the stairs and the bottom of the stairs. If you do not have them, ask someone to add them for you. What else can I do to help prevent falls? Wear shoes that: Do not have high heels. Have rubber bottoms. Are comfortable and fit you  well. Are closed at the toe. Do not wear sandals. If you use a stepladder: Make sure that it is fully opened. Do not climb a closed stepladder. Make sure that both sides of the stepladder are locked into place. Ask someone to hold it for you, if possible. Clearly mark and make sure that you can see: Any grab bars or handrails. First and last steps. Where the edge of each step is. Use tools that help you move around (mobility aids) if they are needed. These include: Canes. Walkers. Scooters. Crutches. Turn on the lights when you go into a dark area. Replace any light bulbs as soon as they burn out. Set up your furniture so you have a clear path. Avoid moving your furniture around. If any of your floors are uneven, fix them. If there are any pets around you, be aware of where they are. Review your medicines with your doctor. Some medicines can make you feel dizzy. This can increase your chance of falling. Ask your doctor what other things that you can do to help prevent falls. This information is not intended to replace advice given to you by your health care provider. Make sure you discuss any questions you have with your health care provider. Document Released: 08/30/2009 Document Revised: 04/10/2016 Document Reviewed: 12/08/2014 Elsevier Interactive Patient Education  2017 [employer].

## 2023-06-04 NOTE — Progress Notes (Signed)
Subjective:   Anita Spencer is a 72 y.o. female who presents for Medicare Annual (Subsequent) preventive examination.  Per patient no change in vitals since last visit, unable to obtain new vitals due to telehealth visit   Visit Complete: Virtual  I connected with  Anita Spencer on 06/04/23 by a audio enabled telemedicine application and verified that I am speaking with the correct person using two identifiers.  Patient Location: Home  Provider Location: Office/Clinic  I discussed the limitations of evaluation and management by telemedicine. The patient expressed understanding and agreed to proceed.   Review of Systems     Cardiac Risk Factors include: advanced age (>78men, >13 women);dyslipidemia     Objective:    Today's Vitals   06/04/23 0921  Weight: 115 lb (52.2 kg)   Body mass index is 20.7 kg/m.     06/04/2023    9:24 AM 05/22/2022    8:28 AM  Advanced Directives  Does Patient Have a Medical Advance Directive? No No  Would patient like information on creating a medical advance directive? No - Patient declined No - Patient declined    Current Medications (verified) Outpatient Encounter Medications as of 06/04/2023  Medication Sig   levothyroxine (SYNTHROID) 50 MCG tablet TAKE 1 TABLET BY MOUTH DAILY  BEFORE BREAKFAST   No facility-administered encounter medications on file as of 06/04/2023.    Allergies (verified) Penicillins   History: Past Medical History:  Diagnosis Date   Thyroid disease    History reviewed. No pertinent surgical history. Family History  Problem Relation Age of Onset   Breast cancer Neg Hx    Social History   Socioeconomic History   Marital status: Divorced    Spouse name: Not on file   Number of children: Not on file   Years of education: Not on file   Highest education level: Not on file  Occupational History   Not on file  Tobacco Use   Smoking status: Never   Smokeless tobacco: Never  Vaping Use   Vaping status: Never  Used  Substance and Sexual Activity   Alcohol use: Not Currently   Drug use: Not Currently   Sexual activity: Not Currently  Other Topics Concern   Not on file  Social History Narrative   Not on file   Social Determinants of Health   Financial Resource Strain: Low Risk  (06/04/2023)   Overall Financial Resource Strain (CARDIA)    Difficulty of Paying Living Expenses: Not hard at all  Food Insecurity: No Food Insecurity (06/04/2023)   Hunger Vital Sign    Worried About Running Out of Food in the Last Year: Never true    Ran Out of Food in the Last Year: Never true  Transportation Needs: No Transportation Needs (06/04/2023)   PRAPARE - Administrator, Civil Service (Medical): No    Lack of Transportation (Non-Medical): No  Physical Activity: Sufficiently Active (06/04/2023)   Exercise Vital Sign    Days of Exercise per Week: 7 days    Minutes of Exercise per Session: 90 min  Stress: No Stress Concern Present (06/04/2023)   [employer] of Occupational Health - Occupational Stress Questionnaire    Feeling of Stress : Not at all  Social Connections: Moderately Isolated (06/04/2023)   Social Connection and Isolation Panel [NHANES]    Frequency of Communication with Friends and Family: More than three times a week    Frequency of Social Gatherings with Friends and Family: More than three times  a week    Attends Religious Services: More than 4 times per year    Active Member of Clubs or Organizations: No    Attends Banker Meetings: Never    Marital Status: Divorced    Tobacco Counseling Counseling given: Not Answered   Clinical Intake:  Pre-visit preparation completed: Yes  Pain : No/denies pain     BMI - recorded: 20.7 Nutritional Status: BMI of 19-24  Normal Nutritional Risks: None Diabetes: No  How often do you need to have someone help you when you read instructions, pamphlets, or other written materials from your doctor or pharmacy?: 1 -  Never  Interpreter Needed?: No  Information entered by :: Lanier Ensign, LPN   Activities of Daily Living    06/04/2023    9:22 AM  In your present state of health, do you have any difficulty performing the following activities:  Hearing? 0  Vision? 0  Difficulty concentrating or making decisions? 0  Walking or climbing stairs? 0  Dressing or bathing? 0  Doing errands, shopping? 0  Preparing Food and eating ? N  Using the Toilet? N  In the past six months, have you accidently leaked urine? N  Do you have problems with loss of bowel control? N  Managing your Medications? N  Managing your Finances? N  Housekeeping or managing your Housekeeping? N    Patient Care Team: Ardith Dark, MD as PCP - General (Family Medicine)  Indicate any recent Medical Services you may have received from other than [facility] providers in the past year (date may be approximate).     Assessment:   This is a routine wellness examination for Anita Spencer.  Hearing/Vision screen Hearing Screening - Comments:: Pt denies any hearing issues  Vision Screening - Comments:: Pt follows up with Dr Zenaida Niece for annual eye exams   Dietary issues and exercise activities discussed:     Goals Addressed             This Visit's Progress    Patient Stated       None at  this time        Depression Screen    06/04/2023    9:23 AM 05/30/2022    8:39 AM 05/22/2022    8:26 AM  PHQ 2/9 Scores  PHQ - 2 Score 0 0 1    Fall Risk    06/04/2023    9:25 AM 05/30/2022    8:39 AM 05/22/2022    8:29 AM  Fall Risk   Falls in the past year? 0 0 0  Number falls in past yr: 0 0 0  Injury with Fall? 0 0 0  Risk for fall due to : Impaired vision No Fall Risks Impaired vision  Follow up Falls prevention discussed  Falls prevention discussed    MEDICARE RISK AT HOME:  Medicare Risk at Home - 06/04/23 0925     Any stairs in or around the home? No    If so, are there any without handrails? No    Home free of loose throw  rugs in walkways, pet beds, electrical cords, etc? Yes    Adequate lighting in your home to reduce risk of falls? Yes    Life alert? No    Use of a cane, walker or w/c? No    Grab bars in the bathroom? No    Shower chair or bench in shower? No    Elevated toilet seat or a handicapped toilet? No  TIMED UP AND GO:  Was the test performed?  No    Cognitive Function:declined         05/22/2022    8:31 AM  6CIT Screen  What Year? 0 points  What month? 0 points  What time? 0 points  Count back from 20 0 points  Months in reverse 0 points  Repeat phrase 0 points  Total Score 0 points    Immunizations Immunization History  Administered Date(s) Administered   Influenza-Unspecified 09/17/2021   Moderna Covid-19 Vaccine Bivalent Booster 73yrs & up 07/30/2021   Moderna Sars-Covid-2 Vaccination 12/20/2019, 01/19/2020, 09/03/2020, 03/18/2021, 08/01/2021   Pneumococcal Conjugate-13 04/17/2017   Pneumococcal Polysaccharide-23 11/17/2018   Tdap 06/11/2016   Zoster Recombinant(Shingrix) 04/18/2015, 07/30/2015    TDAP status: Up to date  Flu Vaccine status: Up to date  Pneumococcal vaccine status: Up to date  Covid-19 vaccine status: Completed vaccines  Qualifies for Shingles Vaccine? Yes   Zostavax completed Yes   Shingrix Completed?: Yes  Screening Tests Health Maintenance  Topic Date Due   COVID-19 Vaccine (7 - 2023-24 season) 07/18/2022   INFLUENZA VACCINE  06/18/2023   Colonoscopy  11/08/2023   Medicare Annual Wellness (AWV)  06/03/2024   DEXA SCAN  06/10/2024   MAMMOGRAM  08/15/2024   DTaP/Tdap/Td (2 - Td or Tdap) 06/11/2026   Pneumonia Vaccine 98+ Years old  Completed   Hepatitis C Screening  Completed   Zoster Vaccines- Shingrix  Completed   HPV VACCINES  Aged Out    Health Maintenance  Health Maintenance Due  Topic Date Due   COVID-19 Vaccine (7 - 2023-24 season) 07/18/2022     Colorectal cancer screening: Type of screening:  Colonoscopy. Completed 11/07/13. Repeat every 10 years  Mammogram status: Completed 08/15/22. Repeat every year  Bone Density status: Completed 06/10/22. Results reflect: Bone density results: OSTEOPOROSIS. Repeat every 2 years.   Additional Screening:  Hepatitis C Screening: Completed 05/30/22  Vision Screening: Recommended annual ophthalmology exams for early detection of glaucoma and other disorders of the eye. Is the patient up to date with their annual eye exam?  Yes  Who is the provider or what is the name of the office in which the patient attends annual eye exams? Dr Zenaida Niece If pt is not established with a provider, would they like to be referred to a provider to establish care? No .   Dental Screening: Recommended annual dental exams for proper oral hygiene    Community Resource Referral / Chronic Care Management: CRR required this visit?  No   CCM required this visit?  No     Plan:     I have personally reviewed and noted the following in the patient's chart:   Medical and social history Use of alcohol, tobacco or illicit drugs  Current medications and supplements including opioid prescriptions. Patient is not currently taking opioid prescriptions. Functional ability and status Nutritional status Physical activity Advanced directives List of other physicians Hospitalizations, surgeries, and ER visits in previous 12 months Vitals Screenings to include cognitive, depression, and falls Referrals and appointments  In addition, I have reviewed and discussed with patient certain preventive protocols, quality metrics, and best practice recommendations. A written personalized care plan for preventive services as well as general preventive health recommendations were provided to patient.     [name], LPN   05/07/3085   After Visit Summary: (MyChart) Due to this being a telephonic visit, the after visit summary with patients personalized plan was offered to patient  via MyChart   Nurse Notes: declined at this time pt knowledgeable to questions asked

## 2023-06-16 ENCOUNTER — Ambulatory Visit: Payer: Medicare Other | Admitting: Family Medicine

## 2023-06-25 ENCOUNTER — Ambulatory Visit: Payer: Medicare Other | Admitting: Family Medicine

## 2023-06-29 ENCOUNTER — Ambulatory Visit: Payer: Medicare Other | Admitting: Family Medicine

## 2023-07-02 ENCOUNTER — Encounter (INDEPENDENT_AMBULATORY_CARE_PROVIDER_SITE_OTHER): Payer: Self-pay

## 2023-07-16 ENCOUNTER — Encounter: Payer: Self-pay | Admitting: Family Medicine

## 2023-07-16 ENCOUNTER — Other Ambulatory Visit: Payer: Self-pay | Admitting: *Deleted

## 2023-07-16 ENCOUNTER — Ambulatory Visit (INDEPENDENT_AMBULATORY_CARE_PROVIDER_SITE_OTHER): Payer: Medicare Other | Admitting: Family Medicine

## 2023-07-16 VITALS — BP 137/78 | HR 62 | Temp 97.7°F | Ht 62.5 in | Wt 110.8 lb

## 2023-07-16 DIAGNOSIS — Z0001 Encounter for general adult medical examination with abnormal findings: Secondary | ICD-10-CM | POA: Diagnosis not present

## 2023-07-16 DIAGNOSIS — E785 Hyperlipidemia, unspecified: Secondary | ICD-10-CM | POA: Diagnosis not present

## 2023-07-16 DIAGNOSIS — M810 Age-related osteoporosis without current pathological fracture: Secondary | ICD-10-CM

## 2023-07-16 DIAGNOSIS — R739 Hyperglycemia, unspecified: Secondary | ICD-10-CM

## 2023-07-16 DIAGNOSIS — E039 Hypothyroidism, unspecified: Secondary | ICD-10-CM | POA: Diagnosis not present

## 2023-07-16 LAB — CBC
HCT: 37.2 % (ref 36.0–46.0)
Hemoglobin: 12.5 g/dL (ref 12.0–15.0)
MCHC: 33.6 g/dL (ref 30.0–36.0)
MCV: 97 fl (ref 78.0–100.0)
Platelets: 198 10*3/uL (ref 150.0–400.0)
RBC: 3.84 Mil/uL — ABNORMAL LOW (ref 3.87–5.11)
RDW: 13.1 % (ref 11.5–15.5)
WBC: 4.6 10*3/uL (ref 4.0–10.5)

## 2023-07-16 LAB — COMPREHENSIVE METABOLIC PANEL
ALT: 13 U/L (ref 0–35)
AST: 25 U/L (ref 0–37)
Albumin: 4.3 g/dL (ref 3.5–5.2)
Alkaline Phosphatase: 56 U/L (ref 39–117)
BUN: 11 mg/dL (ref 6–23)
CO2: 25 mEq/L (ref 19–32)
Calcium: 9.5 mg/dL (ref 8.4–10.5)
Chloride: 98 mEq/L (ref 96–112)
Creatinine, Ser: 0.69 mg/dL (ref 0.40–1.20)
GFR: 87.37 mL/min (ref >60.00)
Glucose, Bld: 87 mg/dL (ref 70–99)
Potassium: 4 mEq/L (ref 3.5–5.1)
Sodium: 133 mEq/L — ABNORMAL LOW (ref 135–145)
Total Bilirubin: 0.9 mg/dL (ref 0.2–1.2)
Total Protein: 7.4 g/dL (ref 6.0–8.3)

## 2023-07-16 LAB — LIPID PANEL
Cholesterol: 211 mg/dL — ABNORMAL HIGH (ref 0–200)
HDL: 90.5 mg/dL (ref >39.00)
LDL Cholesterol: 108 mg/dL — ABNORMAL HIGH (ref 0–99)
NonHDL: 120.46
Total CHOL/HDL Ratio: 2
Triglycerides: 62 mg/dL (ref 0.0–149.0)
VLDL: 12.4 mg/dL (ref 0.0–40.0)

## 2023-07-16 LAB — HEMOGLOBIN A1C: Hgb A1c MFr Bld: 5.6 % (ref 4.6–6.5)

## 2023-07-16 LAB — TSH: TSH: 2.28 u[IU]/mL (ref 0.35–5.50)

## 2023-07-16 NOTE — Patient Instructions (Addendum)
It was very nice to see you today!  We will check blood work today.  Please continue to work on diet and exercise.  Please get your flu shot later this year.  You will be getting a colonoscopy later this year or early next year.  Please get your mammogram soon.  Keep an eye on your blood pressure and let us know if it is persistently elevated.  Return in about 1 year (around 07/15/2024) for Annual Physical.   Take care, Dr Jimmey Ralph  PLEASE NOTE:  If you had any lab tests, please let us know if you have not heard back within a few days. You may see your results on mychart before we have a chance to review them but we will give you a call once they are reviewed by [place].   If we ordered any referrals today, please let us know if you have not heard from their office within the next week.   If you had any urgent prescriptions sent in today, please check with the pharmacy within an hour of our visit to make sure the prescription was transmitted appropriately.   Please try these tips to maintain a healthy lifestyle:  Eat at least 3 REAL meals and 1-2 snacks per day.  Aim for no more than 5 hours between eating.  If you eat breakfast, please do so within one hour of getting up.   Each meal should contain half fruits/vegetables, one quarter protein, and one quarter carbs (no bigger than a computer mouse)  Cut down on sweet beverages. This includes juice, soda, and sweet tea.   Drink at least 1 glass of water with each meal and aim for at least 8 glasses per day  Exercise at least 150 minutes every week.    Preventive Care 50 Years and Older, Female Preventive care refers to lifestyle choices and visits with your health care provider that can promote health and wellness. Preventive care visits are also called wellness exams. What can I expect for my preventive care visit? Counseling Your health care provider may ask you questions about your: Medical history, including: Past medical  problems. Family medical history. Pregnancy and menstrual history. History of falls. Current health, including: Memory and ability to understand (cognition). Emotional well-being. Home life and relationship well-being. Sexual activity and sexual health. Lifestyle, including: Alcohol, nicotine or tobacco, and drug use. Access to firearms. Diet, exercise, and sleep habits. Work and work Astronomer. Sunscreen use. Safety issues such as seatbelt and bike helmet use. Physical exam Your health care provider will check your: Height and weight. These may be used to calculate your BMI (body mass index). BMI is a measurement that tells if you are at a healthy weight. Waist circumference. This measures the distance around your waistline. This measurement also tells if you are at a healthy weight and may help predict your risk of certain diseases, such as type 2 diabetes and high blood pressure. Heart rate and blood pressure. Body temperature. Skin for abnormal spots. What immunizations do I need?  Vaccines are usually given at various ages, according to a schedule. Your health care provider will recommend vaccines for you based on your age, medical history, and lifestyle or other factors, such as travel or where you work. What tests do I need? Screening Your health care provider may recommend screening tests for certain conditions. This may include: Lipid and cholesterol levels. Hepatitis C test. Hepatitis B test. HIV (human immunodeficiency virus) test. STI (sexually transmitted infection) testing, if  you are at risk. Lung cancer screening. Colorectal cancer screening. Diabetes screening. This is done by checking your blood sugar (glucose) after you have not eaten for a while (fasting). Mammogram. Talk with your health care provider about how often you should have regular mammograms. BRCA-related cancer screening. This may be done if you have a family history of breast, ovarian, tubal,  or peritoneal cancers. Bone density scan. This is done to screen for osteoporosis. Talk with your health care provider about your test results, treatment options, and if necessary, the need for more tests. Follow these instructions at home: Eating and drinking  Eat a diet that includes fresh fruits and vegetables, whole grains, lean protein, and low-fat dairy products. Limit your intake of foods with high amounts of sugar, saturated fats, and salt. Take vitamin and mineral supplements as recommended by your health care provider. Do not drink alcohol if your health care provider tells you not to drink. If you drink alcohol: Limit how much you have to 0-1 drink a day. Know how much alcohol is in your drink. In the [place] one drink equals one 12 oz bottle of beer (355 mL), one 5 oz glass of wine (148 mL), or one 1 oz glass of hard liquor (44 mL). Lifestyle Brush your teeth every morning and night with fluoride toothpaste. Floss one time each day. Exercise for at least 30 minutes 5 or more days each week. Do not use any products that contain nicotine or tobacco. These products include cigarettes, chewing tobacco, and vaping devices, such as e-cigarettes. If you need help quitting, ask your health care provider. Do not use drugs. If you are sexually active, practice safe sex. Use a condom or other form of protection in order to prevent STIs. Take aspirin only as told by your health care provider. Make sure that you understand how much to take and what form to take. Work with your health care provider to find out whether it is safe and beneficial for you to take aspirin daily. Ask your health care provider if you need to take a cholesterol-lowering medicine (statin). Find healthy ways to manage stress, such as: Meditation, yoga, or listening to music. Journaling. Talking to a trusted person. Spending time with friends and family. Minimize exposure to UV radiation to reduce your risk of skin  cancer. Safety Always wear your seat belt while driving or riding in a vehicle. Do not drive: If you have been drinking alcohol. Do not ride with someone who has been drinking. When you are tired or distracted. While texting. If you have been using any mind-altering substances or drugs. Wear a helmet and other protective equipment during sports activities. If you have firearms in your house, make sure you follow all gun safety procedures. What's next? Visit your health care provider once a year for an annual wellness visit. Ask your health care provider how often you should have your eyes and teeth checked. Stay up to date on all vaccines. This information is not intended to replace advice given to you by your health care provider. Make sure you discuss any questions you have with your health care provider. Document Revised: 05/01/2021 Document Reviewed: 05/01/2021 Elsevier Patient Education  2024 [employer].

## 2023-07-16 NOTE — Assessment & Plan Note (Signed)
Check TSH.  She is on Synthroid 50 mcg daily.

## 2023-07-16 NOTE — Progress Notes (Signed)
Chief Complaint:  Anita Spencer is a 72 y.o. female who presents today for her annual comprehensive physical exam.    Assessment/Plan:  Chronic Problems Addressed Today: Hypothyroidism Check TSH.  She is on Synthroid 50 mcg daily.  Dyslipidemia Check lipids.  Hyperglycemia Check in with see.  Osteoporosis, last DEXA 2023 She is working on calcium and vitamin D supplementation.  Will recheck DEXA next year.  Preventative Healthcare: Check labs.  Due for mammogram next month.  Will be getting colonoscopy later this year or early next year.  Up-to-date on vaccines.  She will get flu vaccine later this year.  Patient Counseling(The following topics were reviewed and/or handout was given):  -Nutrition: Stressed importance of moderation in sodium/caffeine intake, saturated fat and cholesterol, caloric balance, sufficient intake of fresh fruits, vegetables, and fiber.  -Stressed the importance of regular exercise.   -Substance Abuse: Discussed cessation/primary prevention of tobacco, alcohol, or other drug use; driving or other dangerous activities under the influence; availability of treatment for abuse.   -Injury prevention: Discussed safety belts, safety helmets, smoke detector, smoking near bedding or upholstery.   -Sexuality: Discussed sexually transmitted diseases, partner selection, use of condoms, avoidance of unintended pregnancy and contraceptive alternatives.   -Dental health: Discussed importance of regular tooth brushing, flossing, and dental visits.  -Health maintenance and immunizations reviewed. Please refer to Health maintenance section.  Return to care in 1 year for next preventative visit.     Subjective:  HPI:  She has no acute complaints today. See Assessment / plan for status of chronic conditions.   Lifestyle Diet: Balanced. Plenty of fruits and vegetables.  Exercise: Trying to do a lot of walking.      07/16/2023    7:29 AM  Depression screen PHQ 2/9   Decreased Interest 0  Down, Depressed, Hopeless 0  PHQ - 2 Score 0    Health Maintenance Due  Topic Date Due   COVID-19 Vaccine (7 - 2023-24 season) 07/18/2022   Colonoscopy  11/08/2023     ROS: Per HPI, otherwise a complete review of systems was negative.   PMH:  The following were reviewed and entered/updated in epic: Past Medical History:  Diagnosis Date   Thyroid disease    Patient Active Problem List   Diagnosis Date Noted   Dyslipidemia 06/02/2022   Hyperglycemia 06/02/2022   Sleep-disordered breathing 05/30/2022   Hypothyroidism 01/28/2022   Osteoporosis, last DEXA 2023 01/28/2022   History reviewed. No pertinent surgical history.  Family History  Problem Relation Age of Onset   Breast cancer Neg Hx     Medications- reviewed and updated Current Outpatient Medications  Medication Sig Dispense Refill   levothyroxine (SYNTHROID) 50 MCG tablet TAKE 1 TABLET BY MOUTH DAILY  BEFORE BREAKFAST 100 tablet 2   No current facility-administered medications for this visit.    Allergies-reviewed and updated Allergies  Allergen Reactions   Penicillins     Social History   Socioeconomic History   Marital status: Divorced    Spouse name: Not on file   Number of children: Not on file   Years of education: Not on file   Highest education level: Not on file  Occupational History   Not on file  Tobacco Use   Smoking status: Never   Smokeless tobacco: Never  Vaping Use   Vaping status: Never Used  Substance and Sexual Activity   Alcohol use: Not Currently   Drug use: Not Currently   Sexual activity: Not Currently  Other Topics  Concern   Not on file  Social History Narrative   Not on file   Social Determinants of Health   Financial Resource Strain: Low Risk  (06/04/2023)   Overall Financial Resource Strain (CARDIA)    Difficulty of Paying Living Expenses: Not hard at all  Food Insecurity: No Food Insecurity (06/04/2023)   Hunger Vital Sign    Worried  About Running Out of Food in the Last Year: Never true    Ran Out of Food in the Last Year: Never true  Transportation Needs: No Transportation Needs (06/04/2023)   PRAPARE - Administrator, Civil Service (Medical): No    Lack of Transportation (Non-Medical): No  Physical Activity: Sufficiently Active (06/04/2023)   Exercise Vital Sign    Days of Exercise per Week: 7 days    Minutes of Exercise per Session: 90 min  Stress: No Stress Concern Present (06/04/2023)   [employer] of Occupational Health - Occupational Stress Questionnaire    Feeling of Stress : Not at all  Social Connections: Moderately Isolated (06/04/2023)   Social Connection and Isolation Panel [NHANES]    Frequency of Communication with Friends and Family: More than three times a week    Frequency of Social Gatherings with Friends and Family: More than three times a week    Attends Religious Services: More than 4 times per year    Active Member of [employer] or Organizations: No    Attends Engineer, structural: Never    Marital Status: Divorced        Objective:  Physical Exam: BP 137/78   Pulse 62   Temp 97.7 F (36.5 C) (Temporal)   Ht 5' 2.5" (1.588 m)   Wt 110 lb 12.8 oz (50.3 kg)   SpO2 100%   BMI 19.94 kg/m   Body mass index is 19.94 kg/m. Wt Readings from Last 3 Encounters:  07/16/23 110 lb 12.8 oz (50.3 kg)  06/04/23 115 lb (52.2 kg)  07/03/22 115 lb 6 oz (52.3 kg)   Gen: NAD, resting comfortably HEENT: TMs normal bilaterally. OP clear. No thyromegaly noted.  CV: RRR with no murmurs appreciated Pulm: NWOB, CTAB with no crackles, wheezes, or rhonchi GI: Normal bowel sounds present. Soft, Nontender, Nondistended. MSK: no edema, cyanosis, or clubbing noted Skin: warm, dry Neuro: CN2-12 grossly intact. Strength 5/5 in upper and lower extremities. Reflexes symmetric and intact bilaterally.  Psych: Normal affect and thought content     Yoseline Andersson M. Jimmey Ralph, MD 07/16/2023 8:16 AM

## 2023-07-16 NOTE — Assessment & Plan Note (Signed)
Check in with see.

## 2023-07-16 NOTE — Assessment & Plan Note (Signed)
Check lipids 

## 2023-07-16 NOTE — Assessment & Plan Note (Signed)
She is working on calcium and vitamin D supplementation.  Will recheck DEXA next year.

## 2023-07-17 NOTE — Progress Notes (Signed)
Cholesterol mildly elevated in the borderline range but the rest of her labs are all stable compared to previous values.  Do not need to make any changes to treatment plan at this time.  We can recheck in a year.

## 2023-07-21 ENCOUNTER — Telehealth: Payer: Self-pay | Admitting: Family Medicine

## 2023-07-21 NOTE — Telephone Encounter (Signed)
Pt is asking to have Mammo & Colonoscopy ordered. Please advise

## 2023-07-21 NOTE — Telephone Encounter (Signed)
Please advise 

## 2023-07-21 NOTE — Telephone Encounter (Signed)
Ok with me. Please place any necessary orders. 

## 2023-07-22 ENCOUNTER — Other Ambulatory Visit: Payer: Self-pay | Admitting: Family Medicine

## 2023-07-22 DIAGNOSIS — Z1231 Encounter for screening mammogram for malignant neoplasm of breast: Secondary | ICD-10-CM

## 2023-07-22 NOTE — Telephone Encounter (Signed)
Patient notified colonoscopy not due till 10/2023 Can call to schedule her mammogram the office of her choice

## 2023-07-23 ENCOUNTER — Encounter: Payer: Medicare Other | Admitting: Family Medicine

## 2023-08-17 ENCOUNTER — Telehealth: Payer: Self-pay | Admitting: Family Medicine

## 2023-08-17 ENCOUNTER — Ambulatory Visit: Payer: Medicare Other

## 2023-08-17 NOTE — Telephone Encounter (Signed)
Prescription Request  08/17/2023  LOV: 07/16/2023  What is the name of the medication or equipment? levothyroxine (SYNTHROID) 50 MCG tablet    Have you contacted your pharmacy to request a refill? Yes   Which pharmacy would you like this sent to?  [facility] - [place], [place] - [address] [place] [address] Phone: [phone] Fax: [phone]   Patient notified that their request is being sent to the clinical staff for review and that they should receive a response within 2 business days.   Please advise at Home Phone: [phone]

## 2023-08-18 ENCOUNTER — Other Ambulatory Visit: Payer: Self-pay | Admitting: *Deleted

## 2023-08-18 MED ORDER — LEVOTHYROXINE SODIUM 50 MCG PO TABS: 50.0000 ug | ORAL_TABLET | Freq: Every day | ORAL | 2 refills | Status: DC

## 2023-08-18 NOTE — Telephone Encounter (Signed)
Rx send to pharmacy  

## 2023-09-21 ENCOUNTER — Ambulatory Visit: Payer: Medicare Other

## 2023-09-21 ENCOUNTER — Telehealth: Payer: Self-pay | Admitting: Family Medicine

## 2023-09-21 NOTE — Telephone Encounter (Signed)
Caller is requesting approval from provider to dispense medication since manufacturer is changing from Amneal to Lupin. Caller can be reached @ [phone].

## 2023-09-21 NOTE — Telephone Encounter (Signed)
Ok to dispense Rx

## 2023-09-22 NOTE — Telephone Encounter (Signed)
Just to clarify, does message below mean that pharmacy was contacted and informed of this?

## 2023-09-23 ENCOUNTER — Ambulatory Visit
Admission: RE | Admit: 2023-09-23 | Discharge: 2023-09-23 | Disposition: A | Discharge status: Home / Self Care | Payer: Medicare Other | Source: Ambulatory Visit | Attending: Family Medicine | Admitting: Family Medicine

## 2023-09-23 DIAGNOSIS — Z1231 Encounter for screening mammogram for malignant neoplasm of breast: Secondary | ICD-10-CM

## 2023-10-24 DIAGNOSIS — H26492 Other secondary cataract, left eye: Secondary | ICD-10-CM | POA: Diagnosis not present

## 2023-10-24 DIAGNOSIS — H43813 Vitreous degeneration, bilateral: Secondary | ICD-10-CM | POA: Diagnosis not present

## 2023-10-24 DIAGNOSIS — H35372 Puckering of macula, left eye: Secondary | ICD-10-CM | POA: Diagnosis not present

## 2023-10-24 DIAGNOSIS — H35363 Drusen (degenerative) of macula, bilateral: Secondary | ICD-10-CM | POA: Diagnosis not present

## 2023-10-28 ENCOUNTER — Ambulatory Visit (INDEPENDENT_AMBULATORY_CARE_PROVIDER_SITE_OTHER): Payer: Medicare Other | Admitting: *Deleted

## 2023-10-28 DIAGNOSIS — Z23 Encounter for immunization: Secondary | ICD-10-CM | POA: Diagnosis not present

## 2024-04-29 ENCOUNTER — Ambulatory Visit (INDEPENDENT_AMBULATORY_CARE_PROVIDER_SITE_OTHER): Admitting: Family

## 2024-04-29 ENCOUNTER — Encounter: Payer: Self-pay | Admitting: Family

## 2024-04-29 VITALS — BP 135/86 | HR 75 | Temp 98.6°F | Ht 62.5 in | Wt 116.8 lb

## 2024-04-29 DIAGNOSIS — R051 Acute cough: Secondary | ICD-10-CM

## 2024-04-29 NOTE — Progress Notes (Signed)
 Patient ID: Anita Spencer, female    DOB: [birth year], 73 y.o.   MRN: [id number]  Chief Complaint  Patient presents with   Cough    Pt stated that she has had a cough for the past 3 days, no fever just with a runny nose. Covid test was done at on and it was negative  Discussed the use of AI scribe software for clinical note transcription with the patient, who gave verbal consent to proceed.  History of Present Illness Anita Spencer is a 72 year old female who presents with upper respiratory symptoms including runny nose and phlegm.  Symptoms began a few days ago with a runny nose and coughing up creamy-colored phlegm from her throat. She experiences a scratchy throat, likely from coughing. She has taken Nyquil during the day twice, which causes drowsiness. She has not used antihistamines and reports some water intake. There is no congestion, ear pain, or shortness of breath. She has no asthma. She is concerned about transmitting symptoms to her family, who are currently not sick.  Assessment & Plan Upper Respiratory Infection Symptoms consistent with viral URI. Lungs clear on exam. No antibiotics needed. Expected resolution within a week. - Advised saline nasal spray for nasal symptoms. - Recommended Nyquil at night up to 1 week. - Encouraged increased hydration, 2L water daily. - Ok to take OTC oral antihistamine like generic Xyzal or Claritin for runny nose. - Instructed to call if no improvement by mid-next week.   Subjective:    Outpatient Medications Prior to Visit  Medication Sig Dispense Refill   levothyroxine  (SYNTHROID ) 50 MCG tablet Take 1 tablet (50 mcg total) by mouth daily before breakfast. 100 tablet 2   No facility-administered medications prior to visit.   Past Medical History:  Diagnosis Date   Thyroid  disease    History reviewed. No pertinent surgical history. Allergies  Allergen Reactions   Penicillins       Objective:    Physical Exam Vitals and nursing note  reviewed.  Constitutional:      Appearance: Normal appearance. She is not ill-appearing.     Interventions: Face mask in place.  HENT:     Right Ear: Tympanic membrane and ear canal normal.     Left Ear: Tympanic membrane and ear canal normal.     Nose:     Right Sinus: No frontal sinus tenderness.     Left Sinus: No frontal sinus tenderness.     Mouth/Throat:     Mouth: Mucous membranes are moist.     Pharynx: Posterior oropharyngeal erythema (mild) present. No pharyngeal swelling, oropharyngeal exudate or uvula swelling.     Tonsils: No tonsillar exudate or tonsillar abscesses.   Cardiovascular:     Rate and Rhythm: Normal rate and regular rhythm.  Pulmonary:     Effort: Pulmonary effort is normal.     Breath sounds: Normal breath sounds.   Musculoskeletal:        General: Normal range of motion.  Lymphadenopathy:     Head:     Right side of head: No preauricular or posterior auricular adenopathy.     Left side of head: No preauricular or posterior auricular adenopathy.     Cervical: No cervical adenopathy.   Skin:    General: Skin is warm and dry.   Neurological:     Mental Status: She is alert.   Psychiatric:        Mood and Affect: Mood normal.  Behavior: Behavior normal.    BP 135/86   Pulse 75   Temp 98.6 F (37 C)   Ht 5' 2.5 (1.588 m)   Wt 116 lb 12.8 oz (53 kg)   SpO2 96%   BMI 21.02 kg/m  Wt Readings from Last 3 Encounters:  04/29/24 116 lb 12.8 oz (53 kg)  07/16/23 110 lb 12.8 oz (50.3 kg)  06/04/23 115 lb (52.2 kg)       Versa Gore, NP

## 2024-05-17 ENCOUNTER — Telehealth: Payer: Self-pay | Admitting: Family Medicine

## 2024-05-17 NOTE — Telephone Encounter (Signed)
 Prescription Request  05/17/2024  LOV: 07/16/2023  What is the name of the medication or equipment? levothyroxine  (SYNTHROID ) 50 MCG tablet   Have you contacted your pharmacy to request a refill? Yes   Which pharmacy would you like this sent to?  [facility] - [place], [place] - [address] Phone: [phone]  Fax: [phone]      Patient notified that their request is being sent to the clinical staff for review and that they should receive a response within 2 business days.   Please advise at Mobile [phone] (mobile)

## 2024-05-18 MED ORDER — LEVOTHYROXINE SODIUM 50 MCG PO TABS: 50.0000 ug | ORAL_TABLET | Freq: Every day | ORAL | 0 refills | Status: DC

## 2024-05-18 NOTE — Telephone Encounter (Signed)
 Rx sent.

## 2024-06-09 ENCOUNTER — Ambulatory Visit: Payer: Medicare Other

## 2024-06-09 VITALS — Ht 60.0 in | Wt 116.0 lb

## 2024-06-09 DIAGNOSIS — Z0000 Encounter for general adult medical examination without abnormal findings: Secondary | ICD-10-CM

## 2024-06-09 NOTE — Patient Instructions (Addendum)
 Anita Spencer , Thank you for taking time out of your busy schedule to complete your Annual Wellness Visit with me. I enjoyed our conversation and look forward to speaking with you again next year. I, as well as your care team,  appreciate your ongoing commitment to your health goals. Please review the following plan we discussed and let me know if I can assist you in the future. Your Game plan/ To Do List    Referrals: If you haven't heard from the office you've been referred to, please reach out to them at the phone provided.   Follow up Visits: Next Medicare AWV with our clinical staff: 06/14/25   Have you seen your provider in the last 6 months (3 months if uncontrolled diabetes)? No Next Office Visit with your provider: 07/19/24  Clinician Recommendations:  Aim for 30 minutes of exercise or brisk walking, 6-8 glasses of water, and 5 servings of fruits and vegetables each day.       This is a list of the screening recommended for you and due dates:  Health Maintenance  Topic Date Due   COVID-19 Vaccine (7 - 2024-25 season) 07/19/2023   Medicare Annual Wellness Visit  06/03/2024   Colon Cancer Screening  04/29/2025*   DEXA scan (bone density measurement)  06/10/2024   Flu Shot  06/17/2024   Mammogram  09/22/2024   DTaP/Tdap/Td vaccine (2 - Td or Tdap) 06/11/2026   Pneumococcal Vaccine for age over 42  Completed   Hepatitis C Screening  Completed   Zoster (Shingles) Vaccine  Completed   Hepatitis B Vaccine  Aged Out   HPV Vaccine  Aged Out   Meningitis B Vaccine  Aged Out  *Topic was postponed. The date shown is not the original due date.    Advanced directives: (Declined) Advance directive discussed with you today. Even though you declined this today, please call our office should you change your mind, and we can give you the proper paperwork for you to fill out. Advance Care Planning is important because it:  [x]  Makes sure you receive the medical care that is consistent with your  values, goals, and preferences  [x]  It provides guidance to your family and loved ones and reduces their decisional burden about whether or not they are making the right decisions based on your wishes.  Follow the link provided in your after visit summary or read over the paperwork we have mailed to you to help you started getting your Advance Directives in place. If you need assistance in completing these, please reach out to us  so that we can help you!  See attachments for Preventive Care and Fall Prevention Tips.

## 2024-06-09 NOTE — Progress Notes (Addendum)
 Subjective:   Anita Spencer is a 73 y.o. who presents for a Medicare Wellness preventive visit.  As a reminder, Annual Wellness Visits don't include a physical exam, and some assessments may be limited, especially if this visit is performed virtually. We may recommend an in-person follow-up visit with your provider if needed.  Visit Complete: Virtual I connected with  Anita Spencer on 06/09/24 by a audio enabled telemedicine application and verified that I am speaking with the correct person using two identifiers.  Patient Location: Home  Provider Location: Home Office  I discussed the limitations of evaluation and management by telemedicine. The patient expressed understanding and agreed to proceed.  Vital Signs: Because this visit was a virtual/telehealth visit, some criteria may be missing or patient reported. Any vitals not documented were not able to be obtained and vitals that have been documented are patient reported.  VideoDeclined- This patient declined Librarian, academic. Therefore the visit was completed with audio only.  Persons Participating in Visit: Patient.  AWV Questionnaire: No: Patient Medicare AWV questionnaire was not completed prior to this visit.  Cardiac Risk Factors include: advanced age (>44men, >38 women);dyslipidemia     Objective:    Today's Vitals   06/09/24 0848  Weight: 116 lb (52.6 kg)  Height: 5' (1.524 m)   Body mass index is 22.65 kg/m.     06/09/2024    8:54 AM 06/04/2023    9:24 AM 05/22/2022    8:28 AM  Advanced Directives  Does Patient Have a Medical Advance Directive? No No No  Would patient like information on creating a medical advance directive? No - Patient declined No - Patient declined No - Patient declined    Current Medications (verified) Outpatient Encounter Medications as of 06/09/2024  Medication Sig   levothyroxine  (SYNTHROID ) 50 MCG tablet Take 1 tablet (50 mcg total) by mouth daily before  breakfast.   No facility-administered encounter medications on file as of 06/09/2024.    Allergies (verified) Penicillins   History: Past Medical History:  Diagnosis Date   Thyroid  disease    History reviewed. No pertinent surgical history. Family History  Problem Relation Age of Onset   Breast cancer Neg Hx    Social History   Socioeconomic History   Marital status: Divorced    Spouse name: Not on file   Number of children: Not on file   Years of education: Not on file   Highest education level: Not on file  Occupational History   Not on file  Tobacco Use   Smoking status: Never   Smokeless tobacco: Never  Vaping Use   Vaping status: Never Used  Substance and Sexual Activity   Alcohol use: Not Currently   Drug use: Not Currently   Sexual activity: Not Currently  Other Topics Concern   Not on file  Social History Narrative   Not on file   Social Drivers of Health   Financial Resource Strain: Low Risk  (06/09/2024)   Overall Financial Resource Strain (CARDIA)    Difficulty of Paying Living Expenses: Not hard at all  Food Insecurity: No Food Insecurity (06/09/2024)   Hunger Vital Sign    Worried About Running Out of Food in the Last Year: Never true    Ran Out of Food in the Last Year: Never true  Transportation Needs: No Transportation Needs (06/09/2024)   PRAPARE - Administrator, Civil Service (Medical): No    Lack of Transportation (Non-Medical): No  Physical Activity: Sufficiently Active (06/09/2024)   Exercise Vital Sign    Days of Exercise per Week: 7 days    Minutes of Exercise per Session: 60 min  Stress: No Stress Concern Present (06/09/2024)   [employer] of Occupational Health - Occupational Stress Questionnaire    Feeling of Stress: Not at all  Social Connections: Moderately Isolated (06/09/2024)   Social Connection and Isolation Panel    Frequency of Communication with Friends and Family: More than three times a week    Frequency  of Social Gatherings with Friends and Family: More than three times a week    Attends Religious Services: More than 4 times per year    Active Member of [employer] or Organizations: No    Attends Banker Meetings: Never    Marital Status: Divorced    Tobacco Counseling Counseling given: Not Answered    Clinical Intake:  Pre-visit preparation completed: Yes  Pain : No/denies pain     BMI - recorded: 22.65 Nutritional Status: BMI of 19-24  Normal Diabetes: No  Lab Results  Component Value Date   HGBA1C 5.6 07/16/2023   HGBA1C 5.8 05/30/2022   HGBA1C 5.5 05/15/2021     How often do you need to have someone help you when you read instructions, pamphlets, or other written materials from your doctor or pharmacy?: 1 - Never  Interpreter Needed?: No  Information entered by :: Ellouise Haws, LPN   Activities of Daily Living     06/09/2024    8:50 AM  In your present state of health, do you have any difficulty performing the following activities:  Hearing? 0  Vision? 0  Difficulty concentrating or making decisions? 0  Walking or climbing stairs? 0  Dressing or bathing? 0  Doing errands, shopping? 0  Preparing Food and eating ? N  Using the Toilet? N  In the past six months, have you accidently leaked urine? N  Do you have problems with loss of bowel control? N  Managing your Medications? N  Managing your Finances? N  Housekeeping or managing your Housekeeping? N    Patient Care Team: Kennyth Worth HERO, MD as PCP - General (Family Medicine)  I have updated your Care Teams any recent Medical Services you may have received from other providers in the past year.     Assessment:   This is a routine wellness examination for Anita Spencer.  Hearing/Vision screen Hearing Screening - Comments:: Pt denies any  hearing issues  Vision Screening - Comments:: Wears rx glasses - up to date with routine eye exams with Dr Vivian     Goals Addressed             This  Visit's Progress    Patient Stated       Maintain health and activity       Depression Screen     06/09/2024    8:51 AM 04/29/2024    3:02 PM 07/16/2023    7:29 AM 06/04/2023    9:23 AM 05/30/2022    8:39 AM 05/22/2022    8:26 AM  PHQ 2/9 Scores  PHQ - 2 Score 0 0 0 0 0 1    Fall Risk     06/09/2024    8:54 AM 04/29/2024    2:57 PM 07/16/2023    7:29 AM 06/04/2023    9:25 AM 05/30/2022    8:39 AM  Fall Risk   Falls in the past year? 0 0 0 0 0  Number falls in past yr: 0 0 0 0 0  Injury with Fall? 0 0 0 0 0  Risk for fall due to : No Fall Risks No Fall Risks No Fall Risks Impaired vision No Fall Risks  Follow up Falls evaluation completed Falls evaluation completed  Falls prevention discussed     MEDICARE RISK AT HOME:  Medicare Risk at Home Any stairs in or around the home?: Yes If so, are there any without handrails?: No Home free of loose throw rugs in walkways, pet beds, electrical cords, etc?: Yes Adequate lighting in your home to reduce risk of falls?: Yes Life alert?: No Use of a cane, walker or w/c?: No Grab bars in the bathroom?: No Shower chair or bench in shower?: No Elevated toilet seat or a handicapped toilet?: No  TIMED UP AND GO:  Was the test performed?  No  Cognitive Function: Unable: Due to language barrier, hearing or vision limitations or otherPt alert and able to answr questions appriopriately    06/09/2024    9:43 AM  MMSE - Mini Mental State Exam  Not completed: Unable to complete        05/22/2022    8:31 AM  6CIT Screen  What Year? 0 points  What month? 0 points  What time? 0 points  Count back from 20 0 points  Months in reverse 0 points  Repeat phrase 0 points  Total Score 0 points    Immunizations Immunization History  Administered Date(s) Administered    sv, Bivalent, Protein Subunit Rsvpref,pf (Abrysvo) 10/06/2022   Fluad Quad(high Dose 65+) 09/15/2022   Fluad Trivalent(High Dose 65+) 10/28/2023   Influenza-Unspecified  09/17/2021   Moderna Covid-19 Vaccine Bivalent Booster 24yrs & up 07/30/2021   Moderna Sars-Covid-2 Vaccination 12/20/2019, 01/19/2020, 09/03/2020, 03/18/2021, 08/01/2021   Pneumococcal Conjugate-13 04/17/2017   Pneumococcal Polysaccharide-23 11/17/2018   Tdap 06/11/2016   Zoster Recombinant(Shingrix) 04/18/2015, 07/30/2015, 03/27/2023, 05/26/2023    Screening Tests Health Maintenance  Topic Date Due   COVID-19 Vaccine (7 - 2024-25 season) 07/19/2023   Colonoscopy  04/29/2025 (Originally 11/08/2023)   DEXA SCAN  06/10/2024   INFLUENZA VACCINE  06/17/2024   MAMMOGRAM  09/22/2024   Medicare Annual Wellness (AWV)  06/09/2025   DTaP/Tdap/Td (2 - Td or Tdap) 06/11/2026   Pneumococcal Vaccine: 50+ Years  Completed   Hepatitis C Screening  Completed   Zoster Vaccines- Shingrix  Completed   Hepatitis B Vaccines  Aged Out   HPV VACCINES  Aged Out   Meningococcal B Vaccine  Aged Out    Health Maintenance  Health Maintenance Due  Topic Date Due   COVID-19 Vaccine (7 - 2024-25 season) 07/19/2023   Health Maintenance Items Addressed: See Nurse Notes at the end of this note  Additional Screening:  Vision Screening: Recommended annual ophthalmology exams for early detection of glaucoma and other disorders of the eye. Would you like a referral to an eye doctor? No    Dental Screening: Recommended annual dental exams for proper oral hygiene  Community Resource Referral / Chronic Care Management: CRR required this visit?  No   CCM required this visit?  No   Plan:    I have personally reviewed and noted the following in the patient's chart:   Medical and social history Use of alcohol, tobacco or illicit drugs  Current medications and supplements including opioid prescriptions. Patient is not currently taking opioid prescriptions. Functional ability and status Nutritional status Physical activity Advanced directives List of other physicians Hospitalizations,  surgeries, and  ER visits in previous 12 months Vitals Screenings to include cognitive, depression, and falls Referrals and appointments  In addition, I have reviewed and discussed with patient certain preventive protocols, quality metrics, and best practice recommendations. A written personalized care plan for preventive services as well as general preventive health recommendations were provided to patient.   Ellouise VEAR Haws, LPN   [id number]/[id number]   After Visit Summary: (MyChart) Due to this being a telephonic visit, the after visit summary with patients personalized plan was offered to patient via MyChart   Notes: Nothing significant to report at this time.

## 2024-07-06 ENCOUNTER — Telehealth: Payer: Self-pay | Admitting: Family Medicine

## 2024-07-06 NOTE — Telephone Encounter (Signed)
 Pt requesting to have labs done prior to CPE. Please advise

## 2024-07-07 ENCOUNTER — Other Ambulatory Visit: Payer: Self-pay | Admitting: *Deleted

## 2024-07-07 DIAGNOSIS — E785 Hyperlipidemia, unspecified: Secondary | ICD-10-CM

## 2024-07-07 DIAGNOSIS — Z0001 Encounter for general adult medical examination with abnormal findings: Secondary | ICD-10-CM

## 2024-07-07 DIAGNOSIS — E039 Hypothyroidism, unspecified: Secondary | ICD-10-CM

## 2024-07-07 DIAGNOSIS — R739 Hyperglycemia, unspecified: Secondary | ICD-10-CM

## 2024-07-07 NOTE — Telephone Encounter (Signed)
 Please schedule an office visit prior to CPE appt

## 2024-07-07 NOTE — Telephone Encounter (Signed)
**Note De-identified  [place] Obfuscation** Please advise 

## 2024-07-07 NOTE — Telephone Encounter (Signed)
 Ok with me. Please place any necessary orders.

## 2024-07-12 ENCOUNTER — Other Ambulatory Visit (INDEPENDENT_AMBULATORY_CARE_PROVIDER_SITE_OTHER)

## 2024-07-12 DIAGNOSIS — R739 Hyperglycemia, unspecified: Secondary | ICD-10-CM | POA: Diagnosis not present

## 2024-07-12 DIAGNOSIS — E039 Hypothyroidism, unspecified: Secondary | ICD-10-CM

## 2024-07-12 DIAGNOSIS — E785 Hyperlipidemia, unspecified: Secondary | ICD-10-CM

## 2024-07-12 DIAGNOSIS — Z0001 Encounter for general adult medical examination with abnormal findings: Secondary | ICD-10-CM

## 2024-07-12 LAB — CBC
HCT: 35.2 % — ABNORMAL LOW (ref 36.0–46.0)
Hemoglobin: 11.9 g/dL — ABNORMAL LOW (ref 12.0–15.0)
MCHC: 33.7 g/dL (ref 30.0–36.0)
MCV: 96.4 fl (ref 78.0–100.0)
Platelets: 197 K/uL (ref 150.0–400.0)
RBC: 3.65 Mil/uL — ABNORMAL LOW (ref 3.87–5.11)
RDW: 13.6 % (ref 11.5–15.5)
WBC: 4.3 K/uL (ref 4.0–10.5)

## 2024-07-12 LAB — LIPID PANEL
Cholesterol: 212 mg/dL — ABNORMAL HIGH (ref 0–200)
HDL: 92.5 mg/dL (ref >39.00)
LDL Cholesterol: 111 mg/dL — ABNORMAL HIGH (ref 0–99)
NonHDL: 119.92
Total CHOL/HDL Ratio: 2
Triglycerides: 47 mg/dL (ref 0.0–149.0)
VLDL: 9.4 mg/dL (ref 0.0–40.0)

## 2024-07-12 LAB — COMPREHENSIVE METABOLIC PANEL WITH GFR
ALT: 11 U/L (ref 0–35)
AST: 23 U/L (ref 0–37)
Albumin: 4.4 g/dL (ref 3.5–5.2)
Alkaline Phosphatase: 53 U/L (ref 39–117)
BUN: 11 mg/dL (ref 6–23)
CO2: 26 meq/L (ref 19–32)
Calcium: 9 mg/dL (ref 8.4–10.5)
Chloride: 96 meq/L (ref 96–112)
Creatinine, Ser: 0.57 mg/dL (ref 0.40–1.20)
GFR: 90.85 mL/min (ref >60.00)
Glucose, Bld: 94 mg/dL (ref 70–99)
Potassium: 4.5 meq/L (ref 3.5–5.1)
Sodium: 133 meq/L — ABNORMAL LOW (ref 135–145)
Total Bilirubin: 0.7 mg/dL (ref 0.2–1.2)
Total Protein: 7.4 g/dL (ref 6.0–8.3)

## 2024-07-12 LAB — HEMOGLOBIN A1C: Hgb A1c MFr Bld: 6 % (ref 4.6–6.5)

## 2024-07-12 LAB — TSH: TSH: 2.91 u[IU]/mL (ref 0.35–5.50)

## 2024-07-14 ENCOUNTER — Ambulatory Visit: Payer: Self-pay | Admitting: Family Medicine

## 2024-07-14 DIAGNOSIS — D72818 Other decreased white blood cell count: Secondary | ICD-10-CM

## 2024-07-14 NOTE — Progress Notes (Signed)
 Cholesterol borderline elevated but stable compared to previous.  His blood counts did drop a little bit.  We should recheck this again in a month or so.  Please place future order for CBC with differential, iron panel, and B12.  His A1c is up a little bit into the prediabetic range.  We can recheck this again in a year but he should work on cutting down sweets and carbs.  The rest of his labs are all at goal and we can recheck in a year.

## 2024-07-19 ENCOUNTER — Encounter: Payer: Self-pay | Admitting: Family Medicine

## 2024-07-19 ENCOUNTER — Ambulatory Visit (INDEPENDENT_AMBULATORY_CARE_PROVIDER_SITE_OTHER): Payer: Medicare Other | Admitting: Family Medicine

## 2024-07-19 VITALS — BP 132/83 | HR 66 | Temp 96.6°F | Ht 60.0 in | Wt 113.2 lb

## 2024-07-19 DIAGNOSIS — M810 Age-related osteoporosis without current pathological fracture: Secondary | ICD-10-CM | POA: Diagnosis not present

## 2024-07-19 DIAGNOSIS — Z0001 Encounter for general adult medical examination with abnormal findings: Secondary | ICD-10-CM

## 2024-07-19 DIAGNOSIS — E785 Hyperlipidemia, unspecified: Secondary | ICD-10-CM | POA: Diagnosis not present

## 2024-07-19 DIAGNOSIS — Z1211 Encounter for screening for malignant neoplasm of colon: Secondary | ICD-10-CM | POA: Diagnosis not present

## 2024-07-19 DIAGNOSIS — R739 Hyperglycemia, unspecified: Secondary | ICD-10-CM

## 2024-07-19 DIAGNOSIS — E039 Hypothyroidism, unspecified: Secondary | ICD-10-CM

## 2024-07-19 DIAGNOSIS — Z1239 Encounter for other screening for malignant neoplasm of breast: Secondary | ICD-10-CM | POA: Diagnosis not present

## 2024-07-19 NOTE — Addendum Note (Signed)
 Addended by: IDA ELORA HERO on: 07/19/2024 08:43 AM   Modules accepted: Orders

## 2024-07-19 NOTE — Assessment & Plan Note (Signed)
 A1c mildly elevated at 6.0 on recent labs.  We discussed lifestyle interventions.  We can recheck in 6 months.

## 2024-07-19 NOTE — Assessment & Plan Note (Signed)
 Mildly elevated on recent labs.  We discussed lifestyle interventions.  Recheck in 6 to 12 months.

## 2024-07-19 NOTE — Progress Notes (Signed)
 Chief Complaint:  Anita Spencer is a 73 y.o. female who presents today for her annual comprehensive physical exam.    Assessment/Plan:  New/Acute Problems: Normocytic Anemia  Recent hemoglobin 11.9.  Discussed rechecking labs today however she declined.  She will come back in couple weeks to recheck.  Future orders were placed for CBC, iron panel, and B12.  Chronic Problems Addressed Today: Hypothyroidism History TSH at goal on Synthroid  50 mcg daily.  Osteoporosis, last DEXA 2023 Patient due for bone density.  She has been taking calcium and vitamin D supplementation.  We discussed rechecking DEXA today however she declined.    Dyslipidemia Mildly elevated on recent labs.  We discussed lifestyle interventions.  Recheck in 6 to 12 months.  Hyperglycemia A1c mildly elevated at 6.0 on recent labs.  We discussed lifestyle interventions.  We can recheck in 6 months.  Preventative Healthcare: Will refer for colonoscopy.  Declined DEXA as above.  Patient Counseling(The following topics were reviewed and/or handout was given):  -Nutrition: Stressed importance of moderation in sodium/caffeine intake, saturated fat and cholesterol, caloric balance, sufficient intake of fresh fruits, vegetables, and fiber.  -Stressed the importance of regular exercise.   -Substance Abuse: Discussed cessation/primary prevention of tobacco, alcohol, or other drug use; driving or other dangerous activities under the influence; availability of treatment for abuse.   -Injury prevention: Discussed safety belts, safety helmets, smoke detector, smoking near bedding or upholstery.   -Sexuality: Discussed sexually transmitted diseases, partner selection, use of condoms, avoidance of unintended pregnancy and contraceptive alternatives.   -Dental health: Discussed importance of regular tooth brushing, flossing, and dental visits.  -Health maintenance and immunizations reviewed. Please refer to Health maintenance  section.  Return to care in 1 year for next preventative visit.     Subjective:  HPI:  She has no acute complaints today. Patient is here today for her  annual physical.  See assessment / plan for status of chronic conditions.  Discussed the use of AI scribe software for clinical note transcription with the patient, who gave verbal consent to proceed.  History of Present Illness Anita Spencer is a 73 year old female who presents for follow-up of blood work abnormalities and routine health maintenance.  She has experienced a recent drop in blood counts, identified during a blood test last week. She takes a multivitamin that includes B12, but only at 25% of the recommended daily value.  Her hemoglobin A1c has increased from 5.6 to 6.0 over the past year. She is currently walking for one hour daily and previously swam regularly before the COVID-19 pandemic.  Her cholesterol levels are slightly elevated. She primarily cooks at home and is considering increasing her intake of lean proteins like fish and chicken. She currently eats fish about once every two weeks and is open to increasing this frequency to help manage her cholesterol.  She is due for a bone density scan but has opted not to undergo the test this year. She takes calcium and vitamin D supplements regularly. She also mentioned a pending colonoscopy for colon cancer screening, which was ordered last year but not yet scheduled due to hospital availability.  She drinks tea daily and occasionally coffee, and inquires about the compatibility of these beverages with her vitamin intake. She plans to travel to [place] soon for a couple of months to consult with a Office manager.   Lifestyle Diet: Balanced.  Avoid sugar. Exercise: Walks daily     07/19/2024    7:43 AM  Depression screen PHQ 2/9  Decreased Interest 0  Down, Depressed, Hopeless 0  PHQ - 2 Score 0    Health Maintenance Due  Topic Date Due   DEXA SCAN  06/10/2024    COVID-19 Vaccine (7 - 2025-26 season) 07/18/2024     ROS: Per HPI, otherwise a complete review of systems was negative.   PMH:  The following were reviewed and entered/updated in epic: Past Medical History:  Diagnosis Date   Thyroid  disease    Patient Active Problem List   Diagnosis Date Noted   Dyslipidemia 06/02/2022   Hyperglycemia 06/02/2022   Sleep-disordered breathing 05/30/2022   Hypothyroidism 01/28/2022   Osteoporosis, last DEXA 2023 01/28/2022   History reviewed. No pertinent surgical history.  Family History  Problem Relation Age of Onset   Breast cancer Neg Hx     Medications- reviewed and updated Current Outpatient Medications  Medication Sig Dispense Refill   levothyroxine  (SYNTHROID ) 50 MCG tablet Take 1 tablet (50 mcg total) by mouth daily before breakfast. 100 tablet 0   No current facility-administered medications for this visit.    Allergies-reviewed and updated Allergies  Allergen Reactions   Penicillins     Social History   Socioeconomic History   Marital status: Divorced    Spouse name: Not on file   Number of children: Not on file   Years of education: Not on file   Highest education level: Not on file  Occupational History   Not on file  Tobacco Use   Smoking status: Never   Smokeless tobacco: Never  Vaping Use   Vaping status: Never Used  Substance and Sexual Activity   Alcohol use: Not Currently   Drug use: Not Currently   Sexual activity: Not Currently  Other Topics Concern   Not on file  Social History Narrative   Not on file   Social Drivers of Health   Financial Resource Strain: Low Risk  (06/09/2024)   Overall Financial Resource Strain (CARDIA)    Difficulty of Paying Living Expenses: Not hard at all  Food Insecurity: No Food Insecurity (06/09/2024)   Hunger Vital Sign    Worried About Running Out of Food in the Last Year: Never true    Ran Out of Food in the Last Year: Never true  Transportation Needs: No  Transportation Needs (06/09/2024)   PRAPARE - Administrator, Civil Service (Medical): No    Lack of Transportation (Non-Medical): No  Physical Activity: Sufficiently Active (06/09/2024)   Exercise Vital Sign    Days of Exercise per Week: 7 days    Minutes of Exercise per Session: 60 min  Stress: No Stress Concern Present (06/09/2024)   [employer] of Occupational Health - Occupational Stress Questionnaire    Feeling of Stress: Not at all  Social Connections: Moderately Isolated (06/09/2024)   Social Connection and Isolation Panel    Frequency of Communication with Friends and Family: More than three times a week    Frequency of Social Gatherings with Friends and Family: More than three times a week    Attends Religious Services: More than 4 times per year    Active Member of [employer] or Organizations: No    Attends Banker Meetings: Never    Marital Status: Divorced        Objective:  Physical Exam: BP 132/83   Pulse 66   Temp (!) 96.6 F (35.9 C) (Temporal)   Ht 5' (1.524 m)   Wt 113 lb 3.2  oz (51.3 kg)   SpO2 99%   BMI 22.11 kg/m   Body mass index is 22.11 kg/m. Wt Readings from Last 3 Encounters:  07/19/24 113 lb 3.2 oz (51.3 kg)  06/09/24 116 lb (52.6 kg)  04/29/24 116 lb 12.8 oz (53 kg)   Gen: NAD, resting comfortably HEENT: TMs normal bilaterally. OP clear. No thyromegaly noted.  CV: RRR with no murmurs appreciated Pulm: NWOB, CTAB with no crackles, wheezes, or rhonchi GI: Normal bowel sounds present. Soft, Nontender, Nondistended. MSK: no edema, cyanosis, or clubbing noted Skin: warm, dry Neuro: CN2-12 grossly intact. Strength 5/5 in upper and lower extremities. Reflexes symmetric and intact bilaterally.  Psych: Normal affect and thought content     Julis Haubner M. Kennyth, MD 07/19/2024 8:09 AM

## 2024-07-19 NOTE — Assessment & Plan Note (Signed)
 History TSH at goal on Synthroid  50 mcg daily.

## 2024-07-19 NOTE — Patient Instructions (Signed)
 It was very nice to see you today!  VISIT SUMMARY: Today, we reviewed your recent blood work and discussed routine health maintenance. We addressed your borderline blood sugar levels, slightly elevated cholesterol, and mild anemia. We also talked about your upcoming travel plans and pending health screenings.  YOUR PLAN: ADULT WELLNESS VISIT: Routine wellness visit with normal blood work. -Continue 30 minutes of daily exercise, including walking and swimming. -Increase intake of fruits, vegetables, and leafy greens. -Moderate your tea and coffee consumption.  BORDERLINE HYPERGLYCEMIA: Your A1c has increased to 6.0, indicating borderline high blood sugar levels likely due to dietary habits. -Reduce intake of sugar and starchy carbohydrates such as bread, pasta, rice, and potatoes. -Recheck A1c in 6 months.  HYPERLIPIDEMIA: Your cholesterol levels are slightly elevated. -Make dietary modifications to include more lean proteins such as fish and chicken. -Eat fish, such as salmon, a couple of times per week. -Increase intake of fruits and vegetables.  GENERAL HEALTH MAINTENANCE: Discussed routine health screenings and preventive measures. -You declined a bone density scan for this year. -Attempt to schedule your colonoscopy for colon cancer screening. -Continue with regular mammograms until age 57.  FOLLOW-UP: Plan for follow-up appointments and pending screenings. -Schedule follow-up appointment for blood work in 2 weeks. -Attempt to expedite scheduling for your colonoscopy before your travel to [place].  Return in about 1 year (around 07/19/2025) for Annual Physical.   Take care, Dr Kennyth  PLEASE NOTE:  If you had any lab tests, please let us  know if you have not heard back within a few days. You may see your results on mychart before we have a chance to review them but we will give you a call once they are reviewed by us .   If we ordered any referrals today, please let us  know if  you have not heard from their office within the next week.   If you had any urgent prescriptions sent in today, please check with the pharmacy within an hour of our visit to make sure the prescription was transmitted appropriately.   Please try these tips to maintain a healthy lifestyle:  Eat at least 3 REAL meals and 1-2 snacks per day.  Aim for no more than 5 hours between eating.  If you eat breakfast, please do so within one hour of getting up.   Each meal should contain half fruits/vegetables, one quarter protein, and one quarter carbs (no bigger than a computer mouse)  Cut down on sweet beverages. This includes juice, soda, and sweet tea.   Drink at least 1 glass of water with each meal and aim for at least 8 glasses per day  Exercise at least 150 minutes every week.   Preventive Care 25 Years and Older, Female Preventive care refers to lifestyle choices and visits with your health care provider that can promote health and wellness. Preventive care visits are also called wellness exams. What can I expect for my preventive care visit? Counseling Your health care provider may ask you questions about your: Medical history, including: Past medical problems. Family medical history. Pregnancy and menstrual history. History of falls. Current health, including: Memory and ability to understand (cognition). Emotional well-being. Home life and relationship well-being. Sexual activity and sexual health. Lifestyle, including: Alcohol, nicotine or tobacco, and drug use. Access to firearms. Diet, exercise, and sleep habits. Work and work Astronomer. Sunscreen use. Safety issues such as seatbelt and bike helmet use. Physical exam Your health care provider will check your: Height and weight. These  may be used to calculate your BMI (body mass index). BMI is a measurement that tells if you are at a healthy weight. Waist circumference. This measures the distance around your waistline.  This measurement also tells if you are at a healthy weight and may help predict your risk of certain diseases, such as type 2 diabetes and high blood pressure. Heart rate and blood pressure. Body temperature. Skin for abnormal spots. What immunizations do I need?  Vaccines are usually given at various ages, according to a schedule. Your health care provider will recommend vaccines for you based on your age, medical history, and lifestyle or other factors, such as travel or where you work. What tests do I need? Screening Your health care provider may recommend screening tests for certain conditions. This may include: Lipid and cholesterol levels. Hepatitis C test. Hepatitis B test. HIV (human immunodeficiency virus) test. STI (sexually transmitted infection) testing, if you are at risk. Lung cancer screening. Colorectal cancer screening. Diabetes screening. This is done by checking your blood sugar (glucose) after you have not eaten for a while (fasting). Mammogram. Talk with your health care provider about how often you should have regular mammograms. BRCA-related cancer screening. This may be done if you have a family history of breast, ovarian, tubal, or peritoneal cancers. Bone density scan. This is done to screen for osteoporosis. Talk with your health care provider about your test results, treatment options, and if necessary, the need for more tests. Follow these instructions at home: Eating and drinking  Eat a diet that includes fresh fruits and vegetables, whole grains, lean protein, and low-fat dairy products. Limit your intake of foods with high amounts of sugar, saturated fats, and salt. Take vitamin and mineral supplements as recommended by your health care provider. Do not drink alcohol if your health care provider tells you not to drink. If you drink alcohol: Limit how much you have to 0-1 drink a day. Know how much alcohol is in your drink. In the [place] one drink equals  one 12 oz bottle of beer (355 mL), one 5 oz glass of wine (148 mL), or one 1 oz glass of hard liquor (44 mL). Lifestyle Brush your teeth every morning and night with fluoride toothpaste. Floss one time each day. Exercise for at least 30 minutes 5 or more days each week. Do not use any products that contain nicotine or tobacco. These products include cigarettes, chewing tobacco, and vaping devices, such as e-cigarettes. If you need help quitting, ask your health care provider. Do not use drugs. If you are sexually active, practice safe sex. Use a condom or other form of protection in order to prevent STIs. Take aspirin only as told by your health care provider. Make sure that you understand how much to take and what form to take. Work with your health care provider to find out whether it is safe and beneficial for you to take aspirin daily. Ask your health care provider if you need to take a cholesterol-lowering medicine (statin). Find healthy ways to manage stress, such as: Meditation, yoga, or listening to music. Journaling. Talking to a trusted person. Spending time with friends and family. Minimize exposure to UV radiation to reduce your risk of skin cancer. Safety Always wear your seat belt while driving or riding in a vehicle. Do not drive: If you have been drinking alcohol. Do not ride with someone who has been drinking. When you are tired or distracted. While texting. If you have been using  any mind-altering substances or drugs. Wear a helmet and other protective equipment during sports activities. If you have firearms in your house, make sure you follow all gun safety procedures. What's next? Visit your health care provider once a year for an annual wellness visit. Ask your health care provider how often you should have your eyes and teeth checked. Stay up to date on all vaccines. This information is not intended to replace advice given to you by your health care provider. Make  sure you discuss any questions you have with your health care provider. Document Revised: 05/01/2021 Document Reviewed: 05/01/2021 Elsevier Patient Education  2024 [employer].

## 2024-07-19 NOTE — Assessment & Plan Note (Signed)
 Patient due for bone density.  She has been taking calcium and vitamin D supplementation.  We discussed rechecking DEXA today however she declined.

## 2024-08-02 ENCOUNTER — Encounter: Payer: Self-pay | Admitting: Pediatrics

## 2024-08-18 ENCOUNTER — Other Ambulatory Visit: Payer: Self-pay | Admitting: *Deleted

## 2024-08-18 ENCOUNTER — Telehealth: Payer: Self-pay | Admitting: Family Medicine

## 2024-08-18 ENCOUNTER — Other Ambulatory Visit (INDEPENDENT_AMBULATORY_CARE_PROVIDER_SITE_OTHER)

## 2024-08-18 DIAGNOSIS — D72818 Other decreased white blood cell count: Secondary | ICD-10-CM

## 2024-08-18 LAB — CBC WITH DIFFERENTIAL/PLATELET
Basophils Absolute: 0 K/uL (ref 0.0–0.1)
Basophils Relative: 1 % (ref 0.0–3.0)
Eosinophils Absolute: 0.1 K/uL (ref 0.0–0.7)
Eosinophils Relative: 2.9 % (ref 0.0–5.0)
HCT: 36.1 % (ref 36.0–46.0)
Hemoglobin: 12.2 g/dL (ref 12.0–15.0)
Lymphocytes Relative: 30.1 % (ref 12.0–46.0)
Lymphs Abs: 1.3 K/uL (ref 0.7–4.0)
MCHC: 33.9 g/dL (ref 30.0–36.0)
MCV: 96 fl (ref 78.0–100.0)
Monocytes Absolute: 0.2 K/uL (ref 0.1–1.0)
Monocytes Relative: 5.5 % (ref 3.0–12.0)
Neutro Abs: 2.5 K/uL (ref 1.4–7.7)
Neutrophils Relative %: 60.5 % (ref 43.0–77.0)
Platelets: 195 K/uL (ref 150.0–400.0)
RBC: 3.76 Mil/uL — ABNORMAL LOW (ref 3.87–5.11)
RDW: 13.4 % (ref 11.5–15.5)
WBC: 4.2 K/uL (ref 4.0–10.5)

## 2024-08-18 LAB — VITAMIN B12: Vitamin B-12: 1054 pg/mL — ABNORMAL HIGH (ref 211–911)

## 2024-08-18 MED ORDER — LEVOTHYROXINE SODIUM 50 MCG PO TABS: 50.0000 ug | ORAL_TABLET | Freq: Every day | ORAL | 0 refills | Status: DC

## 2024-08-18 NOTE — Telephone Encounter (Signed)
 Prescription Request  08/18/2024  LOV: 07/19/2024  What is the name of the medication or equipment? levothyroxine  (SYNTHROID ) 50 MCG tablet   Have you contacted your pharmacy to request a refill? Yes   Which pharmacy would you like this sent to?    [facility] - [place], [place] - [address] [place]  [address] Phone: [phone] Fax: [phone]  Patient notified that their request is being sent to the clinical staff for review and that they should receive a response within 2 business days.   Please advise at Mobile [phone] (mobile)

## 2024-08-18 NOTE — Telephone Encounter (Signed)
 Rx Synthroid  send to [facility]

## 2024-08-19 ENCOUNTER — Ambulatory Visit: Payer: Self-pay | Admitting: Family Medicine

## 2024-08-19 LAB — IRON,TIBC AND FERRITIN PANEL
%SAT: 31 % (ref 16–45)
Ferritin: 150 ng/mL (ref 16–288)
Iron: 101 ug/dL (ref 45–160)
TIBC: 329 ug/dL (ref 250–450)

## 2024-08-19 NOTE — Progress Notes (Signed)
 Her blood counts are stable and back to the normal range.  Her B12 and iron levels are normal.  Do not need to do any other testing at this point.  We can recheck in a year at her next physical.

## 2024-08-22 ENCOUNTER — Ambulatory Visit

## 2024-08-22 ENCOUNTER — Encounter: Payer: Self-pay | Admitting: Pediatrics

## 2024-08-22 VITALS — Ht 60.0 in | Wt 116.0 lb

## 2024-08-22 DIAGNOSIS — Z1211 Encounter for screening for malignant neoplasm of colon: Secondary | ICD-10-CM

## 2024-08-22 MED ORDER — NA SULFATE-K SULFATE-MG SULF 17.5-3.13-1.6 GM/177ML PO SOLN: 1.0000 | Freq: Once | ORAL | 0 refills | Status: AC

## 2024-08-22 NOTE — Progress Notes (Signed)

## 2024-08-23 ENCOUNTER — Ambulatory Visit (INDEPENDENT_AMBULATORY_CARE_PROVIDER_SITE_OTHER): Admitting: Emergency Medicine

## 2024-08-23 ENCOUNTER — Encounter: Payer: Self-pay | Admitting: Emergency Medicine

## 2024-08-23 ENCOUNTER — Ambulatory Visit: Payer: Self-pay

## 2024-08-23 VITALS — BP 114/72 | HR 75 | Ht 60.0 in | Wt 112.0 lb

## 2024-08-23 DIAGNOSIS — J029 Acute pharyngitis, unspecified: Secondary | ICD-10-CM | POA: Diagnosis not present

## 2024-08-23 LAB — POC COVID19 BINAXNOW: SARS Coronavirus 2 Ag: NEGATIVE

## 2024-08-23 MED ORDER — AZITHROMYCIN 250 MG PO TABS: ORAL_TABLET | ORAL | 0 refills | Status: AC

## 2024-08-23 NOTE — Assessment & Plan Note (Addendum)
 Erythema on exam.  No exudates Clinically stable.  No red flag signs or symptoms Mild left cervical adenopathy May benefit from daily azithromycin  for 5 days Symptom management discussed Advised to contact the office if no better or worse during the next several days

## 2024-08-23 NOTE — Patient Instructions (Signed)
 Sore Throat  When you have a sore throat, your throat may feel:  Tender.  Burning.  Irritated.  Scratchy.  Painful when you swallow.  Painful when you talk.  Many things can cause a sore throat, such as:  An infection.  Allergies.  Dry air.  Smoke or pollution.  Radiation treatment for cancer.  Gastroesophageal reflux disease (GERD).  A tumor.  A sore throat can be the first sign of another sickness. It can happen with other problems, like:  Coughing.  Sneezing.  Fever.  Swelling of the glands in the neck.  Most sore throats go away without treatment.  Follow these instructions at home:         Medicines  Take over-the-counter and prescription medicines only as told by your doctor.  Children often get sore throats. Do not give your child aspirin.  Use throat sprays to soothe your throat as told by your health care provider.  Managing pain  To help with pain:  Sip warm liquids, such as broth, herbal tea, or warm water.  Eat or drink cold or frozen liquids, such as frozen ice pops.  Rinse your mouth (gargle) with a salt water mixture 3-4 times a day or as needed.  To make salt water, dissolve -1 tsp (3-6 g) of salt in 1 cup (237 mL) of warm water.  Do not swallow this mixture.  Suck on hard candy or throat lozenges.  Put a cool-mist humidifier in your bedroom at night.  Sit in the bathroom with the door closed for 5-10 minutes while you run hot water in the shower.  General instructions  Do not smoke or use any products that contain nicotine or tobacco. If you need help quitting, ask your doctor.  Get plenty of rest.  Drink enough fluid to keep your pee (urine) pale yellow.  Wash your hands often for at least 20 seconds with soap and water. If soap and water are not available, use hand sanitizer.  Contact a doctor if:  You have a fever for more than 2-3 days.  You keep having symptoms for more than 2-3 days.  Your throat does not get better in 7 days.  You have a fever and your symptoms suddenly get worse.  Your  child who is 3 months to 31 years old has a temperature of 102.71F (39C) or higher.  Get help right away if:  You have trouble breathing.  You cannot swallow fluids, soft foods, or your spit.  You have swelling in your throat or neck that gets worse.  You feel like you may vomit (nauseous) and this feeling lasts a long time.  You cannot stop vomiting.  These symptoms may be an emergency. Get help right away. Call your local emergency services (911 in the [place]  Do not wait to see if the symptoms will go away.  Do not drive yourself to the hospital.  Summary  A sore throat is a painful, burning, irritated, or scratchy throat. Many things can cause a sore throat.  Take over-the-counter medicines only as told by your doctor.  Get plenty of rest.  Drink enough fluid to keep your pee (urine) pale yellow.  Contact a doctor if your symptoms get worse or your sore throat does not get better within 7 days.  This information is not intended to replace advice given to you by your health care provider. Make sure you discuss any questions you have with your health care provider.  Document Revised: 01/30/2021 Document  Reviewed: 01/30/2021  Elsevier Patient Education  2024 [employer].

## 2024-08-23 NOTE — Progress Notes (Signed)
 Anita Spencer 73 y.o.   Chief Complaint  Patient presents with   Sore Throat    Patient here for sore throat she states it started Sunday. She states it hurts to swallow, she states it feels like a cut and its very painful . No cough, no fever,     HISTORY OF PRESENT ILLNESS: Acute problem visit today. This is a 73 y.o. female complaining of sore throat that started last Sunday.  Hurts to swallow Denies fever or chills.  Not coughing. No other associated symptoms. No other complaints or medical concerns today.  Sore Throat  Pertinent negatives include no abdominal pain, coughing, diarrhea, headaches, shortness of breath or vomiting.     Prior to Admission medications   Medication Sig Start Date End Date Taking? Authorizing Provider  levothyroxine  (SYNTHROID ) 50 MCG tablet Take 1 tablet (50 mcg total) by mouth daily before breakfast. 08/18/24  Yes Kennyth Worth HERO, MD    Allergies  Allergen Reactions   Penicillins Other (See Comments)    Patient Active Problem List   Diagnosis Date Noted   Dyslipidemia 06/02/2022   Hyperglycemia 06/02/2022   Sleep-disordered breathing 05/30/2022   Hypothyroidism 01/28/2022   Osteoporosis, last DEXA 2023 01/28/2022    Past Medical History:  Diagnosis Date   Thyroid  disease     No past surgical history on file.  Social History   Socioeconomic History   Marital status: Divorced    Spouse name: Not on file   Number of children: Not on file   Years of education: Not on file   Highest education level: Not on file  Occupational History   Not on file  Tobacco Use   Smoking status: Never   Smokeless tobacco: Never  Vaping Use   Vaping status: Never Used  Substance and Sexual Activity   Alcohol use: Not Currently   Drug use: Not Currently   Sexual activity: Not Currently  Other Topics Concern   Not on file  Social History Narrative   Not on file   Social Drivers of Health   Financial Resource Strain: Low Risk  (06/09/2024)    Overall Financial Resource Strain (CARDIA)    Difficulty of Paying Living Expenses: Not hard at all  Food Insecurity: No Food Insecurity (06/09/2024)   Hunger Vital Sign    Worried About Running Out of Food in the Last Year: Never true    Ran Out of Food in the Last Year: Never true  Transportation Needs: No Transportation Needs (06/09/2024)   PRAPARE - Administrator, Civil Service (Medical): No    Lack of Transportation (Non-Medical): No  Physical Activity: Sufficiently Active (06/09/2024)   Exercise Vital Sign    Days of Exercise per Week: 7 days    Minutes of Exercise per Session: 60 min  Stress: No Stress Concern Present (06/09/2024)   [employer] of Occupational Health - Occupational Stress Questionnaire    Feeling of Stress: Not at all  Social Connections: Moderately Isolated (06/09/2024)   Social Connection and Isolation Panel    Frequency of Communication with Friends and Family: More than three times a week    Frequency of Social Gatherings with Friends and Family: More than three times a week    Attends Religious Services: More than 4 times per year    Active Member of [employer] or Organizations: No    Attends Banker Meetings: Never    Marital Status: Divorced  Catering manager Violence: Not At Risk (06/09/2024)  Humiliation, Afraid, Rape, and Kick questionnaire    Fear of Current or Ex-Partner: No    Emotionally Abused: No    Physically Abused: No    Sexually Abused: No    Family History  Problem Relation Age of Onset   Breast cancer Neg Hx    Colon cancer Neg Hx    Rectal cancer Neg Hx    Stomach cancer Neg Hx      Review of Systems  Constitutional: Negative.  Negative for chills and fever.  HENT:  Positive for sore throat.   Respiratory:  Negative for cough and shortness of breath.   Cardiovascular: Negative.  Negative for chest pain and palpitations.  Gastrointestinal:  Negative for abdominal pain, diarrhea, nausea and vomiting.   Genitourinary: Negative.  Negative for dysuria and hematuria.  Skin: Negative.  Negative for rash.  Neurological: Negative.  Negative for dizziness and headaches.  All other systems reviewed and are negative.   Vitals:   08/23/24 0919  BP: 114/72  Pulse: 75  SpO2: 99%    Physical Exam Vitals reviewed.  Constitutional:      Appearance: Normal appearance.  HENT:     Head: Normocephalic.     Mouth/Throat:     Mouth: Mucous membranes are moist.     Pharynx: Oropharynx is clear. Posterior oropharyngeal erythema present. No oropharyngeal exudate.  Neck:     Vascular: No carotid bruit.  Cardiovascular:     Rate and Rhythm: Normal rate and regular rhythm.     Pulses: Normal pulses.     Heart sounds: Normal heart sounds.  Pulmonary:     Effort: Pulmonary effort is normal.     Breath sounds: Normal breath sounds.  Musculoskeletal:     Cervical back: No tenderness.  Lymphadenopathy:     Cervical: Cervical adenopathy (Left more than right) present.  Skin:    General: Skin is warm and dry.  Neurological:     General: No focal deficit present.     Mental Status: She is alert and oriented to person, place, and time.  Psychiatric:        Mood and Affect: Mood normal.        Behavior: Behavior normal.      ASSESSMENT & PLAN: Problem List Items Addressed This Visit       Respiratory   Acute pharyngitis - Primary   Erythema on exam.  No exudates Clinically stable.  No red flag signs or symptoms Mild left cervical adenopathy May benefit from daily azithromycin  for 5 days Symptom management discussed Advised to contact the office if no better or worse during the next several days      Relevant Medications   azithromycin  (ZITHROMAX ) 250 MG tablet   Patient Instructions  Sore Throat When you have a sore throat, your throat may feel: Tender. Burning. Irritated. Scratchy. Painful when you swallow. Painful when you talk. Many things can cause a sore throat, such  as: An infection. Allergies. Dry air. Smoke or pollution. Radiation treatment for cancer. Gastroesophageal reflux disease (GERD). A tumor. A sore throat can be the first sign of another sickness. It can happen with other problems, like: Coughing. Sneezing. Fever. Swelling of the glands in the neck. Most sore throats go away without treatment. Follow these instructions at home:     Medicines Take over-the-counter and prescription medicines only as told by your doctor. Children often get sore throats. Do not give your child aspirin. Use throat sprays to soothe your throat as told by  your health care provider. Managing pain To help with pain: Sip warm liquids, such as broth, herbal tea, or warm water. Eat or drink cold or frozen liquids, such as frozen ice pops. Rinse your mouth (gargle) with a salt water mixture 3-4 times a day or as needed. To make salt water, dissolve -1 tsp (3-6 g) of salt in 1 cup (237 mL) of warm water. Do not swallow this mixture. Suck on hard candy or throat lozenges. Put a cool-mist humidifier in your bedroom at night. Sit in the bathroom with the door closed for 5-10 minutes while you run hot water in the shower. General instructions Do not smoke or use any products that contain nicotine or tobacco. If you need help quitting, ask your doctor. Get plenty of rest. Drink enough fluid to keep your pee (urine) pale yellow. Wash your hands often for at least 20 seconds with soap and water. If soap and water are not available, use hand sanitizer. Contact a doctor if: You have a fever for more than 2-3 days. You keep having symptoms for more than 2-3 days. Your throat does not get better in 7 days. You have a fever and your symptoms suddenly get worse. Your child who is 3 months to 39 years old has a temperature of 102.43F (39C) or higher. Get help right away if: You have trouble breathing. You cannot swallow fluids, soft foods, or your spit. You have  swelling in your throat or neck that gets worse. You feel like you may vomit (nauseous) and this feeling lasts a long time. You cannot stop vomiting. These symptoms may be an emergency. Get help right away. Call your local emergency services (911 in the [place] Do not wait to see if the symptoms will go away. Do not drive yourself to the hospital. Summary A sore throat is a painful, burning, irritated, or scratchy throat. Many things can cause a sore throat. Take over-the-counter medicines only as told by your doctor. Get plenty of rest. Drink enough fluid to keep your pee (urine) pale yellow. Contact a doctor if your symptoms get worse or your sore throat does not get better within 7 days. This information is not intended to replace advice given to you by your health care provider. Make sure you discuss any questions you have with your health care provider. Document Revised: 01/30/2021 Document Reviewed: 01/30/2021 Elsevier Patient Education  2024 Elsevier Inc.    Emil Schaumann, MD [facility]

## 2024-08-23 NOTE — Telephone Encounter (Signed)
 FYI Only or Action Required?: FYI only for provider.  Patient was last seen in primary care on 07/19/2024 by Kennyth Worth HERO, MD.  Called Nurse Triage reporting Sore Throat.  Symptoms began several days ago.  Interventions attempted: Rest, hydration, or home remedies.  Symptoms are: gradually worsening.  Triage Disposition: See Physician Within 24 Hours  Patient/caregiver understands and will follow disposition?: Yes Patient requesting appt today due to painlevel. Appt scheduled at [facility] [place] today Copied from [facility] #[id number]. Topic: Clinical - Red Word Triage >> Aug 23, 2024  8:16 AM Macario HERO wrote: Red Word that prompted transfer to Nurse Triage: Patient said she has a sore throat and its very painful, she can't sleep. Reason for Disposition  SEVERE throat pain (e.g., excruciating)  Answer Assessment - Initial Assessment Questions 1. ONSET: When did the throat start hurting? (Hours or days ago)      Started hurting 2 days ago   2. SEVERITY: How bad is the sore throat? (Scale 1-10; mild, moderate or severe)     Moderate   3. STREP EXPOSURE: Has there been any exposure to strep within the past week? If Yes, ask: What type of contact occurred?      Unsure of exposure  4.  VIRAL SYMPTOMS: Are there any symptoms of a cold, such as a runny nose, cough, hoarse voice or red eyes?      No  5. FEVER: Do you have a fever? If Yes, ask: What is your temperature, how was it measured, and when did it start?     No  6. PUS ON THE TONSILS: Is there pus on the tonsils in the back of your throat?     N/a  7. OTHER SYMPTOMS: Do you have any other symptoms? (e.g., difficulty breathing, headache, rash)     Unable to sleep  8. PREGNANCY: Is there any chance you are pregnant? When was your last menstrual period?     no  Protocols used: Sore Throat-A-AH

## 2024-08-24 NOTE — Telephone Encounter (Signed)
 Pt was already seen [facility] on 08/23/24

## 2024-08-24 NOTE — Telephone Encounter (Signed)
 Patient need and OV for sore throat

## 2024-09-07 NOTE — Progress Notes (Unsigned)
 [facility] History and Physical   Primary Care Physician:  Kennyth Worth HERO, MD   Reason for Procedure:  Colorectal cancer screening  Plan:    Screening colonoscopy     HPI: Anita Spencer is a 73 y.o. female undergoing screening colonoscopy for colorectal cancer screening.  Clinic notes indicate that the patient's last colonoscopy was performed in 2015 and was presumably normal.  No family history of colorectal cancer or polyps.  Patient denies current symptoms of rectal bleeding or change in bowel habits.   Past Medical History:  Diagnosis Date   Thyroid  disease     No past surgical history on file.  Prior to Admission medications   Medication Sig Start Date End Date Taking? Authorizing Provider  azithromycin  (ZITHROMAX ) 250 MG tablet Sig as indicated 08/23/24   [facility] Emil Schanz, MD  levothyroxine  (SYNTHROID ) 50 MCG tablet Take 1 tablet (50 mcg total) by mouth daily before breakfast. 08/18/24   Kennyth Worth HERO, MD    Current Outpatient Medications  Medication Sig Dispense Refill   azithromycin  (ZITHROMAX ) 250 MG tablet Sig as indicated 6 tablet 0   levothyroxine  (SYNTHROID ) 50 MCG tablet Take 1 tablet (50 mcg total) by mouth daily before breakfast. 90 tablet 0   No current facility-administered medications for this visit.    Allergies as of 09/08/2024 - Review Complete 08/23/2024  Allergen Reaction Noted   Penicillins Other (See Comments) 05/29/2021    Family History  Problem Relation Age of Onset   Breast cancer Neg Hx    Colon cancer Neg Hx    Rectal cancer Neg Hx    Stomach cancer Neg Hx     Social History   Socioeconomic History   Marital status: Divorced    Spouse name: Not on file   Number of children: Not on file   Years of education: Not on file   Highest education level: Not on file  Occupational History   Not on file  Tobacco Use   Smoking status: Never   Smokeless tobacco: Never  Vaping Use   Vaping status: Never Used  Substance  and Sexual Activity   Alcohol use: Not Currently   Drug use: Not Currently   Sexual activity: Not Currently  Other Topics Concern   Not on file  Social History Narrative   Not on file   Social Drivers of Health   Financial Resource Strain: Low Risk  (06/09/2024)   Overall Financial Resource Strain (CARDIA)    Difficulty of Paying Living Expenses: Not hard at all  Food Insecurity: No Food Insecurity (06/09/2024)   Hunger Vital Sign    Worried About Running Out of Food in the Last Year: Never true    Ran Out of Food in the Last Year: Never true  Transportation Needs: No Transportation Needs (06/09/2024)   PRAPARE - Administrator, Civil Service (Medical): No    Lack of Transportation (Non-Medical): No  Physical Activity: Sufficiently Active (06/09/2024)   Exercise Vital Sign    Days of Exercise per Week: 7 days    Minutes of Exercise per Session: 60 min  Stress: No Stress Concern Present (06/09/2024)   [employer] of Occupational Health - Occupational Stress Questionnaire    Feeling of Stress: Not at all  Social Connections: Moderately Isolated (06/09/2024)   Social Connection and Isolation Panel    Frequency of Communication with Friends and Family: More than three times a week    Frequency of Social Gatherings with Friends and Family:  More than three times a week    Attends Religious Services: More than 4 times per year    Active Member of Clubs or Organizations: No    Attends Banker Meetings: Never    Marital Status: Divorced  Catering manager Violence: Not At Risk (06/09/2024)   Humiliation, Afraid, Rape, and Kick questionnaire    Fear of Current or Ex-Partner: No    Emotionally Abused: No    Physically Abused: No    Sexually Abused: No    Review of Systems:  All other review of systems negative except as mentioned in the HPI.  Physical Exam: Vital signs There were no vitals taken for this visit.  General:   Alert,  Well-developed,  well-nourished, pleasant and cooperative in NAD Airway:  Mallampati  Lungs:  Clear throughout to auscultation.   Heart:  Regular rate and rhythm; no murmurs, clicks, rubs,  or gallops. Abdomen:  Soft, nontender and nondistended. Normal bowel sounds.   Neuro/Psych:  Normal mood and affect. A and O x 3  Inocente Hausen, MD [facility]

## 2024-09-08 ENCOUNTER — Encounter: Payer: Self-pay | Admitting: Pediatrics

## 2024-09-08 ENCOUNTER — Ambulatory Visit: Admitting: Pediatrics

## 2024-09-08 VITALS — BP 126/68 | HR 58 | Temp 97.6°F | Resp 12 | Ht 60.0 in | Wt 112.0 lb

## 2024-09-08 DIAGNOSIS — K648 Other hemorrhoids: Secondary | ICD-10-CM

## 2024-09-08 DIAGNOSIS — K635 Polyp of colon: Secondary | ICD-10-CM

## 2024-09-08 DIAGNOSIS — Z1211 Encounter for screening for malignant neoplasm of colon: Secondary | ICD-10-CM | POA: Diagnosis not present

## 2024-09-08 DIAGNOSIS — D125 Benign neoplasm of sigmoid colon: Secondary | ICD-10-CM | POA: Diagnosis not present

## 2024-09-08 DIAGNOSIS — D120 Benign neoplasm of cecum: Secondary | ICD-10-CM | POA: Diagnosis not present

## 2024-09-08 MED ORDER — SODIUM CHLORIDE 0.9 % IV SOLN: 500.0000 mL | Freq: Once | INTRAVENOUS | Status: DC

## 2024-09-08 NOTE — Progress Notes (Signed)
 Sedate, gd SR, tolerated procedure well, VSS, report to RN

## 2024-09-08 NOTE — Progress Notes (Signed)
 Called to room to assist during endoscopic procedure.  Patient ID and intended procedure confirmed with present staff. Received instructions for my participation in the procedure from the performing physician.

## 2024-09-08 NOTE — Progress Notes (Signed)
 Pt's states no medical or surgical changes since previsit or office visit.

## 2024-09-08 NOTE — Patient Instructions (Addendum)
 Continue present medications.  Resume previous diet.  Await pathology results.   YOU HAD AN ENDOSCOPIC PROCEDURE TODAY AT THE [facility]:   Refer to the procedure report that was given to you for any specific questions about what was found during the examination.  If the procedure report does not answer your questions, please call your gastroenterologist to clarify.  If you requested that your care partner not be given the details of your procedure findings, then the procedure report has been included in a sealed envelope for you to review at your convenience later.  YOU SHOULD EXPECT: Some feelings of bloating in the abdomen. Passage of more gas than usual.  Walking can help get rid of the air that was put into your GI tract during the procedure and reduce the bloating. If you had a lower endoscopy (such as a colonoscopy or flexible sigmoidoscopy) you may notice spotting of blood in your stool or on the toilet paper. If you underwent a bowel prep for your procedure, you may not have a normal bowel movement for a few days.  Please Note:  You might notice some irritation and congestion in your nose or some drainage.  This is from the oxygen used during your procedure.  There is no need for concern and it should clear up in a day or so.  SYMPTOMS TO REPORT IMMEDIATELY:  Following lower endoscopy (colonoscopy or flexible sigmoidoscopy):  Excessive amounts of blood in the stool  Significant tenderness or worsening of abdominal pains  Swelling of the abdomen that is new, acute  Fever of 100F or higher  For urgent or emergent issues, a gastroenterologist can be reached at any hour by calling [phone]. Do not use MyChart messaging for urgent concerns.    DIET:  We do recommend a small meal at first, but then you may proceed to your regular diet.  Drink plenty of fluids but you should avoid alcoholic beverages for 24 hours.  ACTIVITY:  You should plan to take it easy for the rest  of today and you should NOT DRIVE or use heavy machinery until tomorrow (because of the sedation medicines used during the test).    FOLLOW UP: Our staff will call the number listed on your records the next business day following your procedure.  We will call around 7:15- 8:00 am to check on you and address any questions or concerns that you may have regarding the information given to you following your procedure. If we do not reach you, we will leave a message.     If any biopsies were taken you will be contacted by phone or by letter within the next 1-3 weeks.  Please call us  at [phone] if you have not heard about the biopsies in 3 weeks.    SIGNATURES/CONFIDENTIALITY: You and/or your care partner have signed paperwork which will be entered into your electronic medical record.  These signatures attest to the fact that that the information above on your After Visit Summary has been reviewed and is understood.  Full responsibility of the confidentiality of this discharge information lies with you and/or your care-partner.

## 2024-09-08 NOTE — Op Note (Signed)
 [facility] Patient Name: Anita Spencer Procedure Date: 09/08/2024 1:32 PM MRN: [id number] Endoscopist: Inocente Hausen , MD, [id number] Age: 73 Referring MD:  Date of Birth: 1951/07/18 Gender: Female Account #: [id number] Procedure:                Colonoscopy Indications:              Screening for colorectal malignant neoplasm, Last                            colonoscopy: 2015 Medicines:                Monitored Anesthesia Care Procedure:                Pre-Anesthesia Assessment:                           - Prior to the procedure, a History and Physical                            was performed, and patient medications and                            allergies were reviewed. The patient's tolerance of                            previous anesthesia was also reviewed. The risks                            and benefits of the procedure and the sedation                            options and risks were discussed with the patient.                            All questions were answered, and informed consent                            was obtained. Prior Anticoagulants: The patient has                            taken no anticoagulant or antiplatelet agents. ASA                            Grade Assessment: II - A patient with mild systemic                            disease. After reviewing the risks and benefits,                            the patient was deemed in satisfactory condition to                            undergo the procedure.  After obtaining informed consent, the colonoscope                            was passed under direct vision. Throughout the                            procedure, the patient's blood pressure, pulse, and                            oxygen saturations were monitored continuously. The                            [id number]-HQ190L Colonoscope [id number] was introduced                            through the anus and advanced to the cecum,                             identified by appendiceal orifice and ileocecal                            valve. The colonoscopy was performed without                            difficulty. The patient tolerated the procedure                            well. The quality of the bowel preparation was                            good. The ileocecal valve, appendiceal orifice, and                            rectum were photographed. Scope In: 1:48:15 PM Scope Out: 2:01:04 PM Scope Withdrawal Time: 0 hours 9 minutes 32 seconds  Total Procedure Duration: 0 hours 12 minutes 49 seconds  Findings:                 The perianal and digital rectal examinations were                            normal. Pertinent negatives include normal                            sphincter tone and no palpable rectal lesions.                           Three sessile polyps were found in the sigmoid                            colon and cecum. The polyps were 2 to 4 mm in size.                            These polyps were removed with a cold biopsy  forceps. Resection and retrieval were complete.                           Internal hemorrhoids were found during retroflexion. Complications:            No immediate complications. Estimated blood loss:                            Minimal. Estimated Blood Loss:     Estimated blood loss was minimal. Impression:               - Three 2 to 4 mm polyps in the sigmoid colon and                            in the cecum, removed with a cold biopsy forceps.                            Resected and retrieved.                           - Internal hemorrhoids. Recommendation:           - Discharge patient to home (ambulatory).                           - Await pathology results.                           - Repeat colonoscopy for surveillance based on                            pathology results.                           - The findings and recommendations were discussed                             with the patient's family.                           - Patient has a contact number available for                            emergencies. The signs and symptoms of potential                            delayed complications were discussed with the                            patient. Return to normal activities tomorrow.                            Written discharge instructions were provided to the                            patient. Inocente Hausen, MD 09/08/2024 2:05:11 PM This report has been signed electronically.

## 2024-09-09 ENCOUNTER — Telehealth: Payer: Self-pay

## 2024-09-09 NOTE — Telephone Encounter (Signed)
  Follow up Call-     09/08/2024   12:50 PM  Call back number  Post procedure Call Back phone  # [phone]  Permission to leave phone message Yes     Patient questions:  Do you have a fever, pain , or abdominal swelling? No. Pain Score  0 *  Have you tolerated food without any problems? Yes.    Have you been able to return to your normal activities? Yes.    Do you have any questions about your discharge instructions: Diet   No. Medications  No. Follow up visit  No.  Do you have questions or concerns about your Care? No.  Actions: * If pain score is 4 or above: No action needed, pain <4.

## 2024-09-14 ENCOUNTER — Ambulatory Visit: Payer: Self-pay | Admitting: Pediatrics

## 2024-09-14 LAB — SURGICAL PATHOLOGY

## 2024-09-23 ENCOUNTER — Ambulatory Visit
Admission: RE | Admit: 2024-09-23 | Discharge: 2024-09-23 | Disposition: A | Discharge status: Home / Self Care | Source: Ambulatory Visit | Attending: Family Medicine | Admitting: Family Medicine

## 2024-09-23 DIAGNOSIS — Z1239 Encounter for other screening for malignant neoplasm of breast: Secondary | ICD-10-CM

## 2024-11-13 ENCOUNTER — Other Ambulatory Visit: Payer: Self-pay | Admitting: Family Medicine

## 2025-06-14 ENCOUNTER — Ambulatory Visit
